# Patient Record
Sex: Female | Born: 1960 | Race: Black or African American | Hispanic: No | Marital: Single | Smoking: Former smoker
Health system: Southern US, Community
[De-identification: ages and names within clinical notes are randomized; demographics above are authoritative.]

## PROBLEM LIST (undated history)

## (undated) DIAGNOSIS — I639 Cerebral infarction, unspecified: Secondary | ICD-10-CM

## (undated) DIAGNOSIS — H409 Unspecified glaucoma: Secondary | ICD-10-CM

## (undated) DIAGNOSIS — I10 Essential (primary) hypertension: Secondary | ICD-10-CM

## (undated) DIAGNOSIS — M1990 Unspecified osteoarthritis, unspecified site: Secondary | ICD-10-CM

## (undated) HISTORY — PX: EYE SURGERY: SHX253

---

## 2017-12-29 DIAGNOSIS — F1120 Opioid dependence, uncomplicated: Secondary | ICD-10-CM | POA: Diagnosis not present

## 2018-01-05 DIAGNOSIS — F1120 Opioid dependence, uncomplicated: Secondary | ICD-10-CM | POA: Diagnosis not present

## 2018-01-09 DIAGNOSIS — Z79891 Long term (current) use of opiate analgesic: Secondary | ICD-10-CM | POA: Diagnosis not present

## 2018-01-12 DIAGNOSIS — F1120 Opioid dependence, uncomplicated: Secondary | ICD-10-CM | POA: Diagnosis not present

## 2018-01-19 DIAGNOSIS — F1120 Opioid dependence, uncomplicated: Secondary | ICD-10-CM | POA: Diagnosis not present

## 2018-01-26 DIAGNOSIS — F1120 Opioid dependence, uncomplicated: Secondary | ICD-10-CM | POA: Diagnosis not present

## 2018-01-27 DIAGNOSIS — R6889 Other general symptoms and signs: Secondary | ICD-10-CM | POA: Diagnosis not present

## 2018-02-02 DIAGNOSIS — F1120 Opioid dependence, uncomplicated: Secondary | ICD-10-CM | POA: Diagnosis not present

## 2018-02-06 DIAGNOSIS — Z79891 Long term (current) use of opiate analgesic: Secondary | ICD-10-CM | POA: Diagnosis not present

## 2018-02-09 DIAGNOSIS — F1120 Opioid dependence, uncomplicated: Secondary | ICD-10-CM | POA: Diagnosis not present

## 2018-02-16 DIAGNOSIS — F1120 Opioid dependence, uncomplicated: Secondary | ICD-10-CM | POA: Diagnosis not present

## 2018-02-23 DIAGNOSIS — F1120 Opioid dependence, uncomplicated: Secondary | ICD-10-CM | POA: Diagnosis not present

## 2018-03-02 DIAGNOSIS — F1120 Opioid dependence, uncomplicated: Secondary | ICD-10-CM | POA: Diagnosis not present

## 2018-03-09 DIAGNOSIS — R5383 Other fatigue: Secondary | ICD-10-CM | POA: Diagnosis not present

## 2018-03-09 DIAGNOSIS — E559 Vitamin D deficiency, unspecified: Secondary | ICD-10-CM | POA: Diagnosis not present

## 2018-03-09 DIAGNOSIS — I10 Essential (primary) hypertension: Secondary | ICD-10-CM | POA: Diagnosis not present

## 2018-03-09 DIAGNOSIS — F1120 Opioid dependence, uncomplicated: Secondary | ICD-10-CM | POA: Diagnosis not present

## 2018-03-14 DIAGNOSIS — F1120 Opioid dependence, uncomplicated: Secondary | ICD-10-CM | POA: Diagnosis not present

## 2018-03-16 DIAGNOSIS — F1120 Opioid dependence, uncomplicated: Secondary | ICD-10-CM | POA: Diagnosis not present

## 2018-03-30 DIAGNOSIS — F1120 Opioid dependence, uncomplicated: Secondary | ICD-10-CM | POA: Diagnosis not present

## 2018-04-06 DIAGNOSIS — F1120 Opioid dependence, uncomplicated: Secondary | ICD-10-CM | POA: Diagnosis not present

## 2018-04-10 DIAGNOSIS — F1120 Opioid dependence, uncomplicated: Secondary | ICD-10-CM | POA: Diagnosis not present

## 2018-04-25 DIAGNOSIS — F1120 Opioid dependence, uncomplicated: Secondary | ICD-10-CM | POA: Diagnosis not present

## 2018-04-27 DIAGNOSIS — F1120 Opioid dependence, uncomplicated: Secondary | ICD-10-CM | POA: Diagnosis not present

## 2018-04-30 DIAGNOSIS — F1120 Opioid dependence, uncomplicated: Secondary | ICD-10-CM | POA: Diagnosis not present

## 2018-05-04 DIAGNOSIS — F1120 Opioid dependence, uncomplicated: Secondary | ICD-10-CM | POA: Diagnosis not present

## 2018-05-07 DIAGNOSIS — F1120 Opioid dependence, uncomplicated: Secondary | ICD-10-CM | POA: Diagnosis not present

## 2018-05-14 DIAGNOSIS — F1120 Opioid dependence, uncomplicated: Secondary | ICD-10-CM | POA: Diagnosis not present

## 2018-05-21 DIAGNOSIS — F1120 Opioid dependence, uncomplicated: Secondary | ICD-10-CM | POA: Diagnosis not present

## 2018-05-30 DIAGNOSIS — F1120 Opioid dependence, uncomplicated: Secondary | ICD-10-CM | POA: Diagnosis not present

## 2018-06-06 DIAGNOSIS — F1120 Opioid dependence, uncomplicated: Secondary | ICD-10-CM | POA: Diagnosis not present

## 2018-06-07 DIAGNOSIS — F1120 Opioid dependence, uncomplicated: Secondary | ICD-10-CM | POA: Diagnosis not present

## 2018-06-13 DIAGNOSIS — F1120 Opioid dependence, uncomplicated: Secondary | ICD-10-CM | POA: Diagnosis not present

## 2018-06-20 DIAGNOSIS — F1120 Opioid dependence, uncomplicated: Secondary | ICD-10-CM | POA: Diagnosis not present

## 2018-06-26 DIAGNOSIS — I10 Essential (primary) hypertension: Secondary | ICD-10-CM | POA: Diagnosis not present

## 2018-06-26 DIAGNOSIS — E559 Vitamin D deficiency, unspecified: Secondary | ICD-10-CM | POA: Diagnosis not present

## 2018-06-26 DIAGNOSIS — R5383 Other fatigue: Secondary | ICD-10-CM | POA: Diagnosis not present

## 2018-06-26 DIAGNOSIS — Z0000 Encounter for general adult medical examination without abnormal findings: Secondary | ICD-10-CM | POA: Diagnosis not present

## 2018-06-26 DIAGNOSIS — Z1339 Encounter for screening examination for other mental health and behavioral disorders: Secondary | ICD-10-CM | POA: Diagnosis not present

## 2018-06-26 DIAGNOSIS — Z79891 Long term (current) use of opiate analgesic: Secondary | ICD-10-CM | POA: Diagnosis not present

## 2018-06-26 DIAGNOSIS — R0602 Shortness of breath: Secondary | ICD-10-CM | POA: Diagnosis not present

## 2018-06-26 DIAGNOSIS — Z131 Encounter for screening for diabetes mellitus: Secondary | ICD-10-CM | POA: Diagnosis not present

## 2018-06-27 DIAGNOSIS — F1120 Opioid dependence, uncomplicated: Secondary | ICD-10-CM | POA: Diagnosis not present

## 2018-06-29 DIAGNOSIS — F1120 Opioid dependence, uncomplicated: Secondary | ICD-10-CM | POA: Diagnosis not present

## 2018-07-04 DIAGNOSIS — F1120 Opioid dependence, uncomplicated: Secondary | ICD-10-CM | POA: Diagnosis not present

## 2018-07-06 DIAGNOSIS — F1120 Opioid dependence, uncomplicated: Secondary | ICD-10-CM | POA: Diagnosis not present

## 2018-07-13 DIAGNOSIS — F1120 Opioid dependence, uncomplicated: Secondary | ICD-10-CM | POA: Diagnosis not present

## 2018-07-20 DIAGNOSIS — F1120 Opioid dependence, uncomplicated: Secondary | ICD-10-CM | POA: Diagnosis not present

## 2018-07-27 DIAGNOSIS — F1120 Opioid dependence, uncomplicated: Secondary | ICD-10-CM | POA: Diagnosis not present

## 2018-08-03 DIAGNOSIS — F1120 Opioid dependence, uncomplicated: Secondary | ICD-10-CM | POA: Diagnosis not present

## 2018-08-08 DIAGNOSIS — F1120 Opioid dependence, uncomplicated: Secondary | ICD-10-CM | POA: Diagnosis not present

## 2018-08-10 DIAGNOSIS — F1120 Opioid dependence, uncomplicated: Secondary | ICD-10-CM | POA: Diagnosis not present

## 2018-08-17 DIAGNOSIS — F1120 Opioid dependence, uncomplicated: Secondary | ICD-10-CM | POA: Diagnosis not present

## 2018-08-24 DIAGNOSIS — F1120 Opioid dependence, uncomplicated: Secondary | ICD-10-CM | POA: Diagnosis not present

## 2018-08-25 DIAGNOSIS — F1120 Opioid dependence, uncomplicated: Secondary | ICD-10-CM | POA: Diagnosis not present

## 2018-08-31 DIAGNOSIS — F1120 Opioid dependence, uncomplicated: Secondary | ICD-10-CM | POA: Diagnosis not present

## 2018-09-01 DIAGNOSIS — F1120 Opioid dependence, uncomplicated: Secondary | ICD-10-CM | POA: Diagnosis not present

## 2018-09-07 DIAGNOSIS — F1120 Opioid dependence, uncomplicated: Secondary | ICD-10-CM | POA: Diagnosis not present

## 2018-09-14 DIAGNOSIS — F1120 Opioid dependence, uncomplicated: Secondary | ICD-10-CM | POA: Diagnosis not present

## 2018-09-15 DIAGNOSIS — F1120 Opioid dependence, uncomplicated: Secondary | ICD-10-CM | POA: Diagnosis not present

## 2019-08-16 ENCOUNTER — Other Ambulatory Visit: Payer: Self-pay | Admitting: Family Medicine

## 2019-08-16 DIAGNOSIS — Z1231 Encounter for screening mammogram for malignant neoplasm of breast: Secondary | ICD-10-CM

## 2019-11-12 ENCOUNTER — Ambulatory Visit: Payer: Self-pay

## 2019-12-25 HISTORY — PX: COLONOSCOPY: SHX174

## 2020-03-20 ENCOUNTER — Emergency Department (HOSPITAL_COMMUNITY)
Admission: EM | Admit: 2020-03-20 | Discharge: 2020-03-21 | Disposition: A | Discharge status: Home / Self Care | Payer: Medicare Other | Attending: Emergency Medicine | Admitting: Emergency Medicine

## 2020-03-20 DIAGNOSIS — T783XXA Angioneurotic edema, initial encounter: Secondary | ICD-10-CM

## 2020-03-20 DIAGNOSIS — R600 Localized edema: Secondary | ICD-10-CM | POA: Diagnosis not present

## 2020-03-20 DIAGNOSIS — I10 Essential (primary) hypertension: Secondary | ICD-10-CM | POA: Insufficient documentation

## 2020-03-20 DIAGNOSIS — T464X5A Adverse effect of angiotensin-converting-enzyme inhibitors, initial encounter: Secondary | ICD-10-CM | POA: Insufficient documentation

## 2020-03-20 DIAGNOSIS — Z79899 Other long term (current) drug therapy: Secondary | ICD-10-CM | POA: Insufficient documentation

## 2020-03-20 MED ORDER — DIPHENHYDRAMINE HCL 50 MG/ML IJ SOLN
25.0000 mg | Freq: Once | INTRAMUSCULAR | Status: AC
  Administered 2020-03-20: 25 mg via INTRAVENOUS
  Filled 2020-03-20: qty 1

## 2020-03-20 MED ORDER — TRANEXAMIC ACID-NACL 1000-0.7 MG/100ML-% IV SOLN
1000.0000 mg | Freq: Once | INTRAVENOUS | Status: AC
  Administered 2020-03-20: 1000 mg via INTRAVENOUS
  Filled 2020-03-20: qty 100

## 2020-03-20 MED ORDER — DEXAMETHASONE SODIUM PHOSPHATE 10 MG/ML IJ SOLN
10.0000 mg | Freq: Once | INTRAMUSCULAR | Status: AC
  Administered 2020-03-20: 10 mg via INTRAVENOUS
  Filled 2020-03-20: qty 1

## 2020-03-20 MED ORDER — FAMOTIDINE IN NACL 20-0.9 MG/50ML-% IV SOLN
20.0000 mg | INTRAVENOUS | Status: AC
  Administered 2020-03-20: 20 mg via INTRAVENOUS
  Filled 2020-03-20: qty 50

## 2020-03-20 NOTE — ED Triage Notes (Signed)
Pt to ED with upper and lower lip swelling.  No resp distress at this time.  Pt st's she takes Lisinopril

## 2020-03-20 NOTE — ED Provider Notes (Addendum)
[facility] EMERGENCY DEPARTMENT Provider Note   CSN: [id number] Arrival date & time: 03/20/20  2110     History Chief Complaint  Patient presents with  . Allergic Reaction    Diana Soto is a 59 y.o. female.  59 year old female with past medical history below including hypertension on lisinopril who presents with lip edema.  Patient ate food earlier today including a hummingbird cake that she has had before.  She laid down to sleep for a [name] while and when she got up, her daughter noticed that her upper lip appeared swollen.  Initially it was only on one side but now the entire lip is swollen.  Patient denies any swelling inside her mouth/on her tongue.  She denies any itching, hives, vomiting, breathing problems, vomiting, or other complaints.  This is never happened before.  She denies any new food exposures.  She has been on lisinopril since 2017.  No family history of angioedema.  Daughter gave her 1 Benadryl tablet earlier prior to arrival.  The history is provided by the patient and a relative.  Allergic Reaction      No past medical history on file.  There are no problems to display for this patient.    PMH: Blindness HTN   OB History   No obstetric history on file.     No family history on file.  Social History   Tobacco Use  . Smoking status: Not on file  Substance Use Topics  . Alcohol use: Not on file  . Drug use: Not on file    Home Medications Prior to Admission medications   Not on File    Allergies    Patient has no allergy information on record.  Review of Systems   Review of Systems  Physical Exam Updated Vital Signs BP 107/83   Pulse 71   Temp 98.6 F (37 C) (Oral)   Resp (!) 21   SpO2 93%   Physical Exam Vitals and nursing note reviewed.  Constitutional:      General: She is not in acute distress.    Appearance: She is well-developed.  HENT:     Head: Normocephalic and atraumatic.      Mouth/Throat:     Mouth: Mucous membranes are moist.     Comments: Edema of upper lip with no tongue involvement; poor dentition Eyes:     Comments: Blind b/l  Cardiovascular:     Rate and Rhythm: Normal rate and regular rhythm.     Heart sounds: Normal heart sounds. No murmur heard.   Pulmonary:     Effort: Pulmonary effort is normal.     Breath sounds: Normal breath sounds.  Abdominal:     General: Bowel sounds are normal. There is no distension.     Palpations: Abdomen is soft.     Tenderness: There is no abdominal tenderness.  Musculoskeletal:        General: No swelling.     Cervical back: Neck supple.  Skin:    General: Skin is warm and dry.     Findings: No rash.     Comments: No urticaria  Neurological:     Mental Status: She is alert and oriented to person, place, and time.     Comments: Fluent speech  Psychiatric:        Judgment: Judgment normal.     ED Results / Procedures / Treatments   Labs (all labs ordered are listed, but only abnormal results are displayed)  Labs Reviewed - No data to display  EKG None  Radiology No results found.  Procedures Procedures (including critical care time)  Medications Ordered in ED Medications  dexamethasone (DECADRON) injection 10 mg (10 mg Intravenous Given 03/20/20 2207)  diphenhydrAMINE (BENADRYL) injection 25 mg (25 mg Intravenous Given 03/20/20 2208)  famotidine (PEPCID) IVPB 20 mg premix (0 mg Intravenous Stopped 03/20/20 2244)  tranexamic acid (CYKLOKAPRON) IVPB 1,000 mg (0 mg Intravenous Stopped 03/20/20 2313)    ED Course  I have reviewed the triage vital signs and the nursing notes.      MDM Rules/Calculators/A&P                           Patient was comfortable on exam, reassuring vital signs, denying any breathing problems.  Edema limited to upper lip only.  No symptoms suggestive of anaphylaxis.  I strongly suspect ACE inhibitor angioedema as the patient has no family history of hereditary  angioedema and no new exposures that she can recall.  Gave the patient above medications and instructed to immediately discontinue ACE inhibitor and contact PCP for further instruction regarding BP management.  On reassessment, pt remains comfortable. No progression of swelling. Daughter feels comfortable watching her closely at home and will ensure lisinopril is removed from her medications. I have extensively reviewed return precautions and they voiced understanding.  Final Clinical Impression(s) / ED Diagnoses Final diagnoses:  ACE inhibitor-aggravated angioedema, initial encounter    Rx / DC Orders ED Discharge Orders    None       Zabrina Brotherton, Wenda Overland, MD 03/20/20 2346    Crispin Vogel, Wenda Overland, MD 03/20/20 2358

## 2020-07-29 ENCOUNTER — Other Ambulatory Visit: Payer: Self-pay | Admitting: Orthopedic Surgery

## 2020-07-29 DIAGNOSIS — Z01811 Encounter for preprocedural respiratory examination: Secondary | ICD-10-CM

## 2020-08-04 ENCOUNTER — Other Ambulatory Visit: Payer: Self-pay | Admitting: Orthopedic Surgery

## 2020-08-04 DIAGNOSIS — M19011 Primary osteoarthritis, right shoulder: Secondary | ICD-10-CM

## 2020-08-10 NOTE — Patient Instructions (Addendum)
DUE TO COVID-19 ONLY ONE VISITOR IS ALLOWED TO COME WITH YOU AND STAY IN THE WAITING ROOM ONLY DURING PRE OP AND PROCEDURE DAY OF SURGERY. THE 1 VISITOR  MAY VISIT WITH YOU AFTER SURGERY IN YOUR PRIVATE ROOM DURING VISITING HOURS ONLY!  YOU NEED TO HAVE A COVID 19 TEST ON: 08/11/20 @ 10:00 AM , THIS TEST MUST BE DONE BEFORE SURGERY,  COVID TESTING SITE [place] [address], IT IS ON THE RIGHT GOING OUT [address] APPROXIMATELY  2 MINUTES PAST [employer] ON THE RIGHT. ONCE YOUR COVID TEST IS COMPLETED,  PLEASE BEGIN THE QUARANTINE INSTRUCTIONS AS OUTLINED IN YOUR HANDOUT.                Marnee Spring    Your procedure is scheduled on: 08/13/20   Report to [facility] Main  Entrance   Report to admitting at : 9:20 AM     Call this number if you have problems the morning of surgery [phone]    Remember: NO SOLID FOOD AFTER MIDNIGHT THE NIGHT PRIOR TO SURGERY. NOTHING BY MOUTH EXCEPT CLEAR LIQUIDS UNTIL: 8:50 AM . PLEASE FINISH ENSURE DRINK PER SURGEON ORDER  WHICH NEEDS TO BE COMPLETED AT: 8:50 AM .  CLEAR LIQUID DIET  Foods Allowed                                                                     Foods Excluded  Coffee and tea, regular and decaf                             liquids that you cannot  Plain Jell-O any favor except red or purple                                           see through such as: Fruit ices (not with fruit pulp)                                     milk, soups, orange juice  Iced Popsicles                                    All solid food Carbonated beverages, regular and diet                                    Cranberry, grape and apple juices Sports drinks like Gatorade Lightly seasoned clear broth or consume(fat free) Sugar, honey syrup  Sample Menu Breakfast                                Lunch  Supper Cranberry juice                    Beef broth                             Chicken broth Jell-O                                     Grape juice                           Apple juice Coffee or tea                        Jell-O                                      Popsicle                                                Coffee or tea                        Coffee or tea  _____________________________________________________________________   BRUSH YOUR TEETH MORNING OF SURGERY AND RINSE YOUR MOUTH OUT, NO CHEWING GUM CANDY OR MINTS.    Take these medicines the morning of surgery with A SIP OF WATER: AMLODIPINE,CARVEDILOL,METHADONE.                               You may not have any metal on your body including hair pins and              piercings  Do not wear jewelry, make-up, lotions, powders or perfumes, deodorant             Do not wear nail polish on your fingernails.  Do not shave  48 hours prior to surgery.    Do not bring valuables to the hospital. [place].  Contacts, dentures or bridgework may not be worn into surgery.  Leave suitcase in the car. After surgery it may be brought to your room.     Patients discharged the day of surgery will not be allowed to drive home. IF YOU ARE HAVING SURGERY AND GOING HOME THE SAME DAY, YOU MUST HAVE AN ADULT TO DRIVE YOU HOME AND BE WITH YOU FOR 24 HOURS. YOU MAY GO HOME BY TAXI OR UBER OR ORTHERWISE, BUT AN ADULT MUST ACCOMPANY YOU HOME AND STAY WITH YOU FOR 24 HOURS.  Name and phone number of your driver:  Special Instructions: N/A              Please read over the following fact sheets you were given: _____________________________________________________________________  [facility]- Preparing for Total Shoulder Arthroplasty    Before surgery, you can play an important role. Because skin is not sterile, your skin needs to be as free of germs as possible. You can reduce the number of germs  on your skin by using the following products. . Benzoyl Peroxide  Gel o Reduces the number of germs present on the skin o Applied twice a day to shoulder area starting two days before surgery    ==================================================================  Please follow these instructions carefully:  BENZOYL PEROXIDE 5% GEL  Please do not use if you have an allergy to benzoyl peroxide.   If your skin becomes reddened/irritated stop using the benzoyl peroxide.  Starting two days before surgery, apply as follows: 1. Apply benzoyl peroxide in the morning and at night. Apply after taking a shower. If you are not taking a shower clean entire shoulder front, back, and side along with the armpit with a clean wet washcloth.  2. Place a quarter-sized dollop on your shoulder and rub in thoroughly, making sure to cover the front, back, and side of your shoulder, along with the armpit.   2 days before ____ AM   ____ PM              1 day before ____ AM   ____ PM                         3. Do this twice a day for two days.  (Last application is the night before surgery, AFTER using the CHG soap as described below).  4. Do NOT apply benzoyl peroxide gel on the day of surgery.          [facility] - Preparing for Surgery Before surgery, you can play an important role.  Because skin is not sterile, your skin needs to be as free of germs as possible.  You can reduce the number of germs on your skin by washing with CHG (chlorahexidine gluconate) soap before surgery.  CHG is an antiseptic cleaner which kills germs and bonds with the skin to continue killing germs even after washing. Please DO NOT use if you have an allergy to CHG or antibacterial soaps.  If your skin becomes reddened/irritated stop using the CHG and inform your nurse when you arrive at Short Stay. Do not shave (including legs and underarms) for at least 48 hours prior to the first CHG shower.  You may shave your face/neck. Please follow these instructions carefully:  1.  Shower with CHG Soap the  night before surgery and the  morning of Surgery.  2.  If you choose to wash your hair, wash your hair first as usual with your  normal  shampoo.  3.  After you shampoo, rinse your hair and body thoroughly to remove the  shampoo.                           4.  Use CHG as you would any other liquid soap.  You can apply chg directly  to the skin and wash                       Gently with a scrungie or clean washcloth.  5.  Apply the CHG Soap to your body ONLY FROM THE NECK DOWN.   Do not use on face/ open                           Wound or open sores. Avoid contact with eyes, ears mouth and genitals (private parts).  Wash face,  Genitals (private parts) with your normal soap.             6.  Wash thoroughly, paying special attention to the area where your surgery  will be performed.  7.  Thoroughly rinse your body with warm water from the neck down.  8.  DO NOT shower/wash with your normal soap after using and rinsing off  the CHG Soap.                9.  Pat yourself dry with a clean towel.            10.  Wear clean pajamas.            11.  Place clean sheets on your bed the night of your first shower and do not  sleep with pets. Day of Surgery : Do not apply any lotions/deodorants the morning of surgery.  Please wear clean clothes to the hospital/surgery center.  FAILURE TO FOLLOW THESE INSTRUCTIONS MAY RESULT IN THE CANCELLATION OF YOUR SURGERY PATIENT SIGNATURE_________________________________  NURSE SIGNATURE__________________________________  ________________________________________________________________________   Adam Phenix  An incentive spirometer is a tool that can help keep your lungs clear and active. This tool measures how well you are filling your lungs with each breath. Taking long deep breaths may help reverse or decrease the chance of developing breathing (pulmonary) problems (especially infection) following:  A long period of time when you  are unable to move or be active. BEFORE THE PROCEDURE   If the spirometer includes an indicator to show your best effort, your nurse or respiratory therapist will set it to a desired goal.  If possible, sit up straight or lean slightly forward. Try not to slouch.  Hold the incentive spirometer in an upright position. INSTRUCTIONS FOR USE  1. Sit on the edge of your bed if possible, or sit up as far as you can in bed or on a chair. 2. Hold the incentive spirometer in an upright position. 3. Breathe out normally. 4. Place the mouthpiece in your mouth and seal your lips tightly around it. 5. Breathe in slowly and as deeply as possible, raising the piston or the ball toward the top of the column. 6. Hold your breath for 3-5 seconds or for as long as possible. Allow the piston or ball to fall to the bottom of the column. 7. Remove the mouthpiece from your mouth and breathe out normally. 8. Rest for a few seconds and repeat Steps 1 through 7 at least 10 times every 1-2 hours when you are awake. Take your time and take a few normal breaths between deep breaths. 9. The spirometer may include an indicator to show your best effort. Use the indicator as a goal to work toward during each repetition. 10. After each set of 10 deep breaths, practice coughing to be sure your lungs are clear. If you have an incision (the cut made at the time of surgery), support your incision when coughing by placing a pillow or rolled up towels firmly against it. Once you are able to get out of bed, walk around indoors and cough well. You may stop using the incentive spirometer when instructed by your caregiver.  RISKS AND COMPLICATIONS  Take your time so you do not get dizzy or light-headed.  If you are in pain, you may need to take or ask for pain medication before doing incentive spirometry. It is harder to take a deep breath if you are having pain.  AFTER USE  Rest and breathe slowly and easily.  It can be helpful to  keep track of a log of your progress. Your caregiver can provide you with a simple table to help with this. If you are using the spirometer at home, follow these instructions: [place] IF:   You are having difficultly using the spirometer.  You have trouble using the spirometer as often as instructed.  Your pain medication is not giving enough relief while using the spirometer.  You develop fever of 100.5 F (38.1 C) or higher. SEEK IMMEDIATE MEDICAL CARE IF:   You cough up bloody sputum that had not been present before.  You develop fever of 102 F (38.9 C) or greater.  You develop worsening pain at or near the incision site. MAKE SURE YOU:   Understand these instructions.  Will watch your condition.  Will get help right away if you are not doing well or get worse. Document Released: 08/22/2006 Document Revised: 07/04/2011 Document Reviewed: 10/23/2006 [facility] Patient Information 2014 [place], [place].   ________________________________________________________________________

## 2020-08-11 ENCOUNTER — Other Ambulatory Visit: Payer: Self-pay

## 2020-08-11 ENCOUNTER — Ambulatory Visit
Admission: RE | Admit: 2020-08-11 | Discharge: 2020-08-11 | Disposition: A | Discharge status: Home / Self Care | Payer: Medicare Other | Source: Ambulatory Visit | Attending: Orthopedic Surgery | Admitting: Orthopedic Surgery

## 2020-08-11 ENCOUNTER — Encounter (HOSPITAL_COMMUNITY)
Admission: RE | Admit: 2020-08-11 | Discharge: 2020-08-11 | Disposition: A | Discharge status: Home / Self Care | Payer: Medicare Other | Source: Ambulatory Visit | Attending: Orthopedic Surgery | Admitting: Orthopedic Surgery

## 2020-08-11 ENCOUNTER — Encounter (HOSPITAL_COMMUNITY): Payer: Self-pay

## 2020-08-11 ENCOUNTER — Other Ambulatory Visit (HOSPITAL_COMMUNITY)
Admission: RE | Admit: 2020-08-11 | Discharge: 2020-08-11 | Disposition: A | Discharge status: Home / Self Care | Payer: Medicare Other | Source: Ambulatory Visit | Attending: Orthopedic Surgery | Admitting: Orthopedic Surgery

## 2020-08-11 ENCOUNTER — Ambulatory Visit (HOSPITAL_COMMUNITY)
Admission: RE | Admit: 2020-08-11 | Discharge: 2020-08-11 | Disposition: A | Discharge status: Home / Self Care | Payer: Medicare Other | Source: Ambulatory Visit | Attending: Orthopedic Surgery | Admitting: Orthopedic Surgery

## 2020-08-11 DIAGNOSIS — I517 Cardiomegaly: Secondary | ICD-10-CM | POA: Insufficient documentation

## 2020-08-11 DIAGNOSIS — Z20822 Contact with and (suspected) exposure to covid-19: Secondary | ICD-10-CM | POA: Insufficient documentation

## 2020-08-11 DIAGNOSIS — D480 Neoplasm of uncertain behavior of bone and articular cartilage: Secondary | ICD-10-CM | POA: Insufficient documentation

## 2020-08-11 DIAGNOSIS — Z01811 Encounter for preprocedural respiratory examination: Secondary | ICD-10-CM

## 2020-08-11 DIAGNOSIS — M19011 Primary osteoarthritis, right shoulder: Secondary | ICD-10-CM

## 2020-08-11 DIAGNOSIS — I77819 Aortic ectasia, unspecified site: Secondary | ICD-10-CM | POA: Insufficient documentation

## 2020-08-11 DIAGNOSIS — Z01818 Encounter for other preprocedural examination: Secondary | ICD-10-CM | POA: Diagnosis not present

## 2020-08-11 HISTORY — DX: Unspecified glaucoma: H40.9

## 2020-08-11 HISTORY — DX: Essential (primary) hypertension: I10

## 2020-08-11 HISTORY — DX: Unspecified osteoarthritis, unspecified site: M19.90

## 2020-08-11 HISTORY — DX: Cerebral infarction, unspecified: I63.9

## 2020-08-11 LAB — URINALYSIS, MICROSCOPIC (REFLEX)
Bacteria, UA: NONE SEEN
Squamous Epithelial / HPF: NONE SEEN (ref 0–5)
WBC, UA: NONE SEEN WBC/hpf (ref 0–5)

## 2020-08-11 LAB — URINALYSIS, ROUTINE W REFLEX MICROSCOPIC
Bilirubin Urine: NEGATIVE
Glucose, UA: NEGATIVE mg/dL
Ketones, ur: NEGATIVE mg/dL
Leukocytes,Ua: NEGATIVE
Nitrite: NEGATIVE
Protein, ur: 100 mg/dL — AB
Specific Gravity, Urine: 1.025 (ref 1.005–1.030)
pH: 6 (ref 5.0–8.0)

## 2020-08-11 LAB — SURGICAL PCR SCREEN
MRSA, PCR: NEGATIVE
Staphylococcus aureus: NEGATIVE

## 2020-08-11 LAB — CBC WITH DIFFERENTIAL/PLATELET
Abs Immature Granulocytes: 0.02 10*3/uL (ref 0.00–0.07)
Basophils Absolute: 0 10*3/uL (ref 0.0–0.1)
Basophils Relative: 0 %
Eosinophils Absolute: 0.7 10*3/uL — ABNORMAL HIGH (ref 0.0–0.5)
Eosinophils Relative: 7 %
HCT: 42.1 % (ref 36.0–46.0)
Hemoglobin: 13 g/dL (ref 12.0–15.0)
Immature Granulocytes: 0 %
Lymphocytes Relative: 26 %
Lymphs Abs: 2.6 10*3/uL (ref 0.7–4.0)
MCH: 24.1 pg — ABNORMAL LOW (ref 26.0–34.0)
MCHC: 30.9 g/dL (ref 30.0–36.0)
MCV: 78 fL — ABNORMAL LOW (ref 80.0–100.0)
Monocytes Absolute: 1 10*3/uL (ref 0.1–1.0)
Monocytes Relative: 10 %
Neutro Abs: 5.4 10*3/uL (ref 1.7–7.7)
Neutrophils Relative %: 57 %
Platelets: 174 10*3/uL (ref 150–400)
RBC: 5.4 MIL/uL — ABNORMAL HIGH (ref 3.87–5.11)
RDW: 14 % (ref 11.5–15.5)
WBC: 9.7 10*3/uL (ref 4.0–10.5)
nRBC: 0 % (ref 0.0–0.2)

## 2020-08-11 LAB — COMPREHENSIVE METABOLIC PANEL
ALT: 14 U/L (ref 0–44)
AST: 27 U/L (ref 15–41)
Albumin: 3.6 g/dL (ref 3.5–5.0)
Alkaline Phosphatase: 95 U/L (ref 38–126)
Anion gap: 11 (ref 5–15)
BUN: 17 mg/dL (ref 6–20)
CO2: 24 mmol/L (ref 22–32)
Calcium: 9.6 mg/dL (ref 8.9–10.3)
Chloride: 103 mmol/L (ref 98–111)
Creatinine, Ser: 0.9 mg/dL (ref 0.44–1.00)
GFR, Estimated: >60 mL/min (ref >60)
Glucose, Bld: 81 mg/dL (ref 70–99)
Potassium: 4.4 mmol/L (ref 3.5–5.1)
Sodium: 138 mmol/L (ref 135–145)
Total Bilirubin: 0.6 mg/dL (ref 0.3–1.2)
Total Protein: 7.4 g/dL (ref 6.5–8.1)

## 2020-08-11 LAB — SARS CORONAVIRUS 2 (TAT 6-24 HRS): SARS Coronavirus 2: NEGATIVE

## 2020-08-11 LAB — APTT: aPTT: 22 seconds — ABNORMAL LOW (ref 24–36)

## 2020-08-11 NOTE — Progress Notes (Addendum)
COVID Vaccine Completed: Date COVID Vaccine completed: COVID vaccine manufacturer: [employer]   PCP - Dr. Kristie Cowman Cardiologist -   Chest x-ray -  EKG -  Stress Test -  ECHO -  Cardiac Cath -  Pacemaker/ICD device last checked:  Sleep Study -  CPAP -   Fasting Blood Sugar -  Checks Blood Sugar _____ times a day  Blood Thinner Instructions: Aspirin Instructions: Last Dose:  Anesthesia review: hX: HTN,stroke,glaucoma(legally blind)  Patient denies shortness of breath, fever, cough and chest pain at PAT appointment   Patient verbalized understanding of instructions that were given to them at the PAT appointment. Patient was also instructed that they will need to review over the PAT instructions again at home before surgery.

## 2020-08-12 LAB — TYPE AND SCREEN
ABO/RH(D): O POS
Antibody Screen: NEGATIVE

## 2020-08-13 ENCOUNTER — Ambulatory Visit (HOSPITAL_COMMUNITY): Payer: Medicare (Managed Care) | Admitting: Anesthesiology

## 2020-08-13 ENCOUNTER — Observation Stay (HOSPITAL_COMMUNITY): Payer: Medicare (Managed Care)

## 2020-08-13 ENCOUNTER — Encounter (HOSPITAL_COMMUNITY): Payer: Self-pay | Admitting: Orthopedic Surgery

## 2020-08-13 ENCOUNTER — Observation Stay (HOSPITAL_COMMUNITY)
Admission: RE | Admit: 2020-08-13 | Discharge: 2020-08-14 | Disposition: A | Discharge status: Home / Self Care | Payer: Medicare (Managed Care) | Attending: Orthopedic Surgery | Admitting: Orthopedic Surgery

## 2020-08-13 ENCOUNTER — Other Ambulatory Visit: Payer: Self-pay

## 2020-08-13 ENCOUNTER — Ambulatory Visit (HOSPITAL_COMMUNITY): Payer: Medicare (Managed Care) | Admitting: Emergency Medicine

## 2020-08-13 ENCOUNTER — Encounter (HOSPITAL_COMMUNITY)
Admission: RE | Disposition: A | Discharge status: Home / Self Care | Payer: Self-pay | Source: Home / Self Care | Attending: Orthopedic Surgery

## 2020-08-13 DIAGNOSIS — Z87891 Personal history of nicotine dependence: Secondary | ICD-10-CM | POA: Diagnosis not present

## 2020-08-13 DIAGNOSIS — M19011 Primary osteoarthritis, right shoulder: Principal | ICD-10-CM | POA: Insufficient documentation

## 2020-08-13 DIAGNOSIS — Z8673 Personal history of transient ischemic attack (TIA), and cerebral infarction without residual deficits: Secondary | ICD-10-CM | POA: Diagnosis not present

## 2020-08-13 DIAGNOSIS — Z96611 Presence of right artificial shoulder joint: Secondary | ICD-10-CM

## 2020-08-13 DIAGNOSIS — Z79899 Other long term (current) drug therapy: Secondary | ICD-10-CM | POA: Diagnosis not present

## 2020-08-13 DIAGNOSIS — I10 Essential (primary) hypertension: Secondary | ICD-10-CM | POA: Insufficient documentation

## 2020-08-13 DIAGNOSIS — Z9889 Other specified postprocedural states: Secondary | ICD-10-CM | POA: Diagnosis present

## 2020-08-13 HISTORY — PX: TOTAL SHOULDER ARTHROPLASTY: SHX126

## 2020-08-13 LAB — ABO/RH: ABO/RH(D): O POS

## 2020-08-13 SURGERY — ARTHROPLASTY, SHOULDER, TOTAL
Anesthesia: Regional | Site: Shoulder | Laterality: Right

## 2020-08-13 MED ORDER — FERROUS GLUCONATE 324 (38 FE) MG PO TABS
648.0000 mg | ORAL_TABLET | Freq: Every day | ORAL | Status: DC
  Administered 2020-08-14: 648 mg via ORAL
  Filled 2020-08-13 (×3): qty 2

## 2020-08-13 MED ORDER — FENTANYL CITRATE (PF) 100 MCG/2ML IJ SOLN: 25.0000 ug | INTRAMUSCULAR | Status: DC | PRN

## 2020-08-13 MED ORDER — CEFAZOLIN SODIUM-DEXTROSE 2-4 GM/100ML-% IV SOLN
2.0000 g | INTRAVENOUS | Status: AC
  Administered 2020-08-13: 2 g via INTRAVENOUS
  Filled 2020-08-13: qty 100

## 2020-08-13 MED ORDER — METHOCARBAMOL 500 MG PO TABS
500.0000 mg | ORAL_TABLET | Freq: Four times a day (QID) | ORAL | Status: DC | PRN
  Administered 2020-08-13 – 2020-08-14 (×3): 500 mg via ORAL
  Filled 2020-08-13 (×3): qty 1

## 2020-08-13 MED ORDER — ZOLPIDEM TARTRATE 5 MG PO TABS: 5.0000 mg | ORAL_TABLET | Freq: Every evening | ORAL | Status: DC | PRN

## 2020-08-13 MED ORDER — MENTHOL 3 MG MT LOZG: 1.0000 | LOZENGE | OROMUCOSAL | Status: DC | PRN

## 2020-08-13 MED ORDER — FENTANYL CITRATE (PF) 100 MCG/2ML IJ SOLN
INTRAMUSCULAR | Status: AC
  Filled 2020-08-13: qty 2

## 2020-08-13 MED ORDER — CARVEDILOL 12.5 MG PO TABS
12.5000 mg | ORAL_TABLET | Freq: Two times a day (BID) | ORAL | Status: DC
  Administered 2020-08-13 – 2020-08-14 (×2): 12.5 mg via ORAL
  Filled 2020-08-13 (×2): qty 1

## 2020-08-13 MED ORDER — METOCLOPRAMIDE HCL 5 MG/ML IJ SOLN: 5.0000 mg | Freq: Three times a day (TID) | INTRAMUSCULAR | Status: DC | PRN

## 2020-08-13 MED ORDER — DOCUSATE SODIUM 100 MG PO CAPS
100.0000 mg | ORAL_CAPSULE | Freq: Two times a day (BID) | ORAL | Status: DC
  Administered 2020-08-13 – 2020-08-14 (×2): 100 mg via ORAL
  Filled 2020-08-13 (×2): qty 1

## 2020-08-13 MED ORDER — OXYCODONE HCL 5 MG PO TABS
5.0000 mg | ORAL_TABLET | ORAL | Status: DC | PRN
  Filled 2020-08-13: qty 2

## 2020-08-13 MED ORDER — ASPIRIN EC 325 MG PO TBEC
325.0000 mg | DELAYED_RELEASE_TABLET | Freq: Every day | ORAL | Status: DC
  Administered 2020-08-14: 325 mg via ORAL
  Filled 2020-08-13: qty 1

## 2020-08-13 MED ORDER — LIDOCAINE 2% (20 MG/ML) 5 ML SYRINGE
INTRAMUSCULAR | Status: AC
  Filled 2020-08-13: qty 5

## 2020-08-13 MED ORDER — TRANEXAMIC ACID-NACL 1000-0.7 MG/100ML-% IV SOLN
1000.0000 mg | INTRAVENOUS | Status: AC
  Administered 2020-08-13: 1000 mg via INTRAVENOUS
  Filled 2020-08-13: qty 100

## 2020-08-13 MED ORDER — METOCLOPRAMIDE HCL 5 MG PO TABS: 5.0000 mg | ORAL_TABLET | Freq: Three times a day (TID) | ORAL | Status: DC | PRN

## 2020-08-13 MED ORDER — SPIRONOLACTONE 25 MG PO TABS
25.0000 mg | ORAL_TABLET | Freq: Every day | ORAL | Status: DC
  Administered 2020-08-14: 25 mg via ORAL
  Filled 2020-08-13 (×2): qty 1

## 2020-08-13 MED ORDER — PHENOL 1.4 % MT LIQD: 1.0000 | OROMUCOSAL | Status: DC | PRN

## 2020-08-13 MED ORDER — LACTATED RINGERS IV SOLN: INTRAVENOUS | Status: DC

## 2020-08-13 MED ORDER — DEXAMETHASONE SODIUM PHOSPHATE 4 MG/ML IJ SOLN
INTRAMUSCULAR | Status: DC | PRN
  Administered 2020-08-13: 10 mg via INTRAVENOUS

## 2020-08-13 MED ORDER — PROPOFOL 10 MG/ML IV BOLUS
INTRAVENOUS | Status: AC
  Filled 2020-08-13: qty 20

## 2020-08-13 MED ORDER — ACETAMINOPHEN 10 MG/ML IV SOLN: 1000.0000 mg | Freq: Once | INTRAVENOUS | Status: DC | PRN

## 2020-08-13 MED ORDER — FENTANYL CITRATE (PF) 100 MCG/2ML IJ SOLN
50.0000 ug | Freq: Once | INTRAMUSCULAR | Status: AC
  Administered 2020-08-13: 50 ug via INTRAVENOUS
  Filled 2020-08-13: qty 2

## 2020-08-13 MED ORDER — ONDANSETRON HCL 4 MG PO TABS: 4.0000 mg | ORAL_TABLET | Freq: Four times a day (QID) | ORAL | Status: DC | PRN

## 2020-08-13 MED ORDER — ROCURONIUM BROMIDE 10 MG/ML (PF) SYRINGE
PREFILLED_SYRINGE | INTRAVENOUS | Status: AC
  Filled 2020-08-13: qty 10

## 2020-08-13 MED ORDER — METHOCARBAMOL 1000 MG/10ML IJ SOLN
500.0000 mg | Freq: Four times a day (QID) | INTRAVENOUS | Status: DC | PRN
  Filled 2020-08-13: qty 5

## 2020-08-13 MED ORDER — SODIUM CHLORIDE 0.9 % IR SOLN
Status: DC | PRN
  Administered 2020-08-13 (×2): 1000 mL

## 2020-08-13 MED ORDER — OXYCODONE HCL 5 MG PO TABS
10.0000 mg | ORAL_TABLET | ORAL | Status: DC | PRN
  Administered 2020-08-13: 15 mg via ORAL
  Administered 2020-08-13 – 2020-08-14 (×2): 10 mg via ORAL
  Filled 2020-08-13: qty 2
  Filled 2020-08-13: qty 3

## 2020-08-13 MED ORDER — WATER FOR IRRIGATION, STERILE IR SOLN
Status: DC | PRN
  Administered 2020-08-13: 2000 mL

## 2020-08-13 MED ORDER — ONDANSETRON HCL 4 MG/2ML IJ SOLN: 4.0000 mg | Freq: Four times a day (QID) | INTRAMUSCULAR | Status: DC | PRN

## 2020-08-13 MED ORDER — HYDROMORPHONE HCL 1 MG/ML IJ SOLN
0.5000 mg | INTRAMUSCULAR | Status: DC | PRN
  Administered 2020-08-13 – 2020-08-14 (×3): 1 mg via INTRAVENOUS
  Filled 2020-08-13 (×3): qty 1

## 2020-08-13 MED ORDER — ORAL CARE MOUTH RINSE: 15.0000 mL | Freq: Once | OROMUCOSAL | Status: AC

## 2020-08-13 MED ORDER — ONDANSETRON HCL 4 MG/2ML IJ SOLN
INTRAMUSCULAR | Status: DC | PRN
  Administered 2020-08-13: 4 mg via INTRAVENOUS

## 2020-08-13 MED ORDER — PROPOFOL 10 MG/ML IV BOLUS
INTRAVENOUS | Status: DC | PRN
  Administered 2020-08-13: 160 mg via INTRAVENOUS

## 2020-08-13 MED ORDER — SENNOSIDES-DOCUSATE SODIUM 8.6-50 MG PO TABS: 1.0000 | ORAL_TABLET | Freq: Every evening | ORAL | Status: DC | PRN

## 2020-08-13 MED ORDER — BUPIVACAINE LIPOSOME 1.3 % IJ SUSP
INTRAMUSCULAR | Status: DC | PRN
  Administered 2020-08-13: 10 mL via PERINEURAL

## 2020-08-13 MED ORDER — METHADONE HCL 10 MG PO TABS
40.0000 mg | ORAL_TABLET | Freq: Every day | ORAL | Status: DC
  Administered 2020-08-14: 40 mg via ORAL
  Filled 2020-08-13: qty 4

## 2020-08-13 MED ORDER — SUGAMMADEX SODIUM 200 MG/2ML IV SOLN
INTRAVENOUS | Status: DC | PRN
  Administered 2020-08-13: 200 mg via INTRAVENOUS

## 2020-08-13 MED ORDER — SODIUM CHLORIDE 0.9 % IV SOLN: INTRAVENOUS | Status: DC

## 2020-08-13 MED ORDER — PHENYLEPHRINE 40 MCG/ML (10ML) SYRINGE FOR IV PUSH (FOR BLOOD PRESSURE SUPPORT)
PREFILLED_SYRINGE | INTRAVENOUS | Status: DC | PRN
  Administered 2020-08-13: 40 ug via INTRAVENOUS

## 2020-08-13 MED ORDER — LIDOCAINE 2% (20 MG/ML) 5 ML SYRINGE
INTRAMUSCULAR | Status: DC | PRN
  Administered 2020-08-13: 60 mg via INTRAVENOUS

## 2020-08-13 MED ORDER — MIDAZOLAM HCL 2 MG/2ML IJ SOLN
1.0000 mg | Freq: Once | INTRAMUSCULAR | Status: AC
Start: 2020-08-13 — End: 2020-08-13
  Administered 2020-08-13: 2 mg via INTRAVENOUS
  Filled 2020-08-13: qty 2

## 2020-08-13 MED ORDER — ROCURONIUM BROMIDE 10 MG/ML (PF) SYRINGE
PREFILLED_SYRINGE | INTRAVENOUS | Status: DC | PRN
  Administered 2020-08-13: 60 mg via INTRAVENOUS

## 2020-08-13 MED ORDER — CHLORHEXIDINE GLUCONATE 0.12 % MT SOLN
15.0000 mL | Freq: Once | OROMUCOSAL | Status: AC
  Administered 2020-08-13: 15 mL via OROMUCOSAL

## 2020-08-13 MED ORDER — HYDROCHLOROTHIAZIDE 12.5 MG PO CAPS
12.5000 mg | ORAL_CAPSULE | Freq: Every day | ORAL | Status: DC
  Administered 2020-08-14: 12.5 mg via ORAL
  Filled 2020-08-13: qty 1

## 2020-08-13 MED ORDER — BUPIVACAINE HCL (PF) 0.5 % IJ SOLN
INTRAMUSCULAR | Status: DC | PRN
  Administered 2020-08-13: 15 mL via PERINEURAL

## 2020-08-13 MED ORDER — OXYCODONE HCL 5 MG PO TABS
5.0000 mg | ORAL_TABLET | Freq: Once | ORAL | Status: DC | PRN
Start: 2020-08-13 — End: 2020-08-13

## 2020-08-13 MED ORDER — OXYCODONE HCL 5 MG/5ML PO SOLN: 5.0000 mg | Freq: Once | ORAL | Status: DC | PRN

## 2020-08-13 MED ORDER — FENTANYL CITRATE (PF) 100 MCG/2ML IJ SOLN
INTRAMUSCULAR | Status: DC | PRN
  Administered 2020-08-13: 100 ug via INTRAVENOUS
  Administered 2020-08-13 (×2): 50 ug via INTRAVENOUS

## 2020-08-13 MED ORDER — PROMETHAZINE HCL 25 MG/ML IJ SOLN: 6.2500 mg | INTRAMUSCULAR | Status: DC | PRN

## 2020-08-13 MED ORDER — PHENYLEPHRINE 40 MCG/ML (10ML) SYRINGE FOR IV PUSH (FOR BLOOD PRESSURE SUPPORT)
PREFILLED_SYRINGE | INTRAVENOUS | Status: AC
  Filled 2020-08-13: qty 10

## 2020-08-13 MED ORDER — ACETAMINOPHEN 325 MG PO TABS: 325.0000 mg | ORAL_TABLET | Freq: Four times a day (QID) | ORAL | Status: DC | PRN

## 2020-08-13 MED ORDER — AMLODIPINE BESYLATE 10 MG PO TABS
10.0000 mg | ORAL_TABLET | Freq: Every day | ORAL | Status: DC
  Administered 2020-08-14: 10 mg via ORAL
  Filled 2020-08-13: qty 1

## 2020-08-13 MED ORDER — DIPHENHYDRAMINE HCL 12.5 MG/5ML PO ELIX: 12.5000 mg | ORAL_SOLUTION | ORAL | Status: DC | PRN

## 2020-08-13 SURGICAL SUPPLY — 62 items
BAG ZIPLOCK 12X15 (MISCELLANEOUS) ×2 IMPLANT
BIT DRILL 1.6MX128 (BIT) ×2 IMPLANT
BLADE SAW SAG 73X25 THK (BLADE) ×1
BLADE SAW SGTL 73X25 THK (BLADE) ×1 IMPLANT
CEMENT BONE DEPUY (Cement) ×2 IMPLANT
COOLER ICEMAN CLASSIC (MISCELLANEOUS) ×2 IMPLANT
COVER BACK TABLE 60X90IN (DRAPES) ×2 IMPLANT
COVER SURGICAL LIGHT HANDLE (MISCELLANEOUS) ×2 IMPLANT
COVER WAND RF STERILE (DRAPES) IMPLANT
DRAPE INCISE IOBAN 66X45 STRL (DRAPES) ×2 IMPLANT
DRAPE ORTHO SPLIT 77X108 STRL (DRAPES) ×2
DRAPE POUCH INSTRU U-SHP 10X18 (DRAPES) ×2 IMPLANT
DRAPE SURG 17X11 SM STRL (DRAPES) ×2 IMPLANT
DRAPE SURG ORHT 6 SPLT 77X108 (DRAPES) ×2 IMPLANT
DRAPE TOP 10253 STERILE (DRAPES) ×2 IMPLANT
DRAPE U-SHAPE 47X51 STRL (DRAPES) ×2 IMPLANT
DRSG AQUACEL AG ADV 3.5X 6 (GAUZE/BANDAGES/DRESSINGS) ×2 IMPLANT
DURAPREP 26ML APPLICATOR (WOUND CARE) ×4 IMPLANT
ELECT BLADE TIP CTD 4 INCH (ELECTRODE) ×2 IMPLANT
ELECT REM PT RETURN 15FT ADLT (MISCELLANEOUS) ×2 IMPLANT
GLENOID PEG SHOULDER 40MM SML (Shoulder) ×1 IMPLANT
GLOVE SRG 8 PF TXTR STRL LF DI (GLOVE) ×1 IMPLANT
GLOVE SURG ENC MOIS LTX SZ7 (GLOVE) ×2 IMPLANT
GLOVE SURG ENC MOIS LTX SZ7.5 (GLOVE) ×2 IMPLANT
GLOVE SURG UNDER POLY LF SZ7 (GLOVE) ×2 IMPLANT
GLOVE SURG UNDER POLY LF SZ8 (GLOVE) ×1
GOWN STRL REUS W/TWL LRG LVL3 (GOWN DISPOSABLE) ×2 IMPLANT
GOWN STRL REUS W/TWL XL LVL3 (GOWN DISPOSABLE) ×2 IMPLANT
GUIDEWIRE GLENOID 2.5X220 (WIRE) ×2 IMPLANT
HANDPIECE INTERPULSE COAX TIP (DISPOSABLE) ×1
HEAD HUM AEQUALIS 43X16 (Head) ×2 IMPLANT
HEMOSTAT SURGICEL 2X14 (HEMOSTASIS) ×2 IMPLANT
HOOD PEEL AWAY FLYTE STAYCOOL (MISCELLANEOUS) ×6 IMPLANT
KIT BASIN OR (CUSTOM PROCEDURE TRAY) ×2 IMPLANT
KIT TURNOVER KIT A (KITS) ×2 IMPLANT
MANIFOLD NEPTUNE II (INSTRUMENTS) ×2 IMPLANT
NEEDLE TROCAR POINT SZ 2 1/2 (NEEDLE) ×2 IMPLANT
NS IRRIG 1000ML POUR BTL (IV SOLUTION) ×2 IMPLANT
PACK SHOULDER (CUSTOM PROCEDURE TRAY) ×2 IMPLANT
PAD COLD SHLDR WRAP-ON (PAD) ×2 IMPLANT
PROTECTOR NERVE ULNAR (MISCELLANEOUS) IMPLANT
RESTRAINT HEAD UNIVERSAL NS (MISCELLANEOUS) ×2 IMPLANT
RETRIEVER SUT HEWSON (MISCELLANEOUS) ×2 IMPLANT
SET HNDPC FAN SPRY TIP SCT (DISPOSABLE) ×1 IMPLANT
SHOULDER GLENOID PEG 40MM SML (Shoulder) ×2 IMPLANT
SLING ARM IMMOBILIZER LRG (SOFTGOODS) ×2 IMPLANT
SMARTMIX MINI TOWER (MISCELLANEOUS) ×2
SPONGE LAP 18X18 RF (DISPOSABLE) ×2 IMPLANT
STEM HUM AEQUALIS PF SZ1A 66 (Stem) ×2 IMPLANT
STRIP CLOSURE SKIN 1/2X4 (GAUZE/BANDAGES/DRESSINGS) ×2 IMPLANT
SUCTION FRAZIER HANDLE 12FR (TUBING) ×1
SUCTION TUBE FRAZIER 12FR DISP (TUBING) ×1 IMPLANT
SUPPORT WRAP ARM LG (MISCELLANEOUS) ×2 IMPLANT
SUT ETHIBOND 2 V 37 (SUTURE) ×4 IMPLANT
SUT MNCRL AB 4-0 PS2 18 (SUTURE) ×2 IMPLANT
SUT VIC AB 2-0 CT1 27 (SUTURE) ×2
SUT VIC AB 2-0 CT1 TAPERPNT 27 (SUTURE) ×2 IMPLANT
TAPE LABRALWHITE 1.5X36 (TAPE) ×4 IMPLANT
TAPE SUT LABRALTAP WHT/BLK (SUTURE) ×2 IMPLANT
TOWEL OR 17X26 10 PK STRL BLUE (TOWEL DISPOSABLE) ×2 IMPLANT
TOWER SMARTMIX MINI (MISCELLANEOUS) ×1 IMPLANT
WATER STERILE IRR 1000ML POUR (IV SOLUTION) ×4 IMPLANT

## 2020-08-13 NOTE — Anesthesia Preprocedure Evaluation (Addendum)
Anesthesia Evaluation  Patient identified by MRN, date of birth, ID band Patient awake    Reviewed: Allergy & Precautions, NPO status , Patient's Chart, lab work & pertinent test results  Airway Mallampati: II       Dental  (+) Missing, Poor Dentition   Pulmonary neg pulmonary ROS, former smoker,    Pulmonary exam normal        Cardiovascular hypertension, Pt. on medications and Pt. on home beta blockers  Rhythm:Regular     Neuro/Psych CVA negative psych ROS   GI/Hepatic negative GI ROS, Neg liver ROS,   Endo/Other  negative endocrine ROS  Renal/GU negative Renal ROS  negative genitourinary   Musculoskeletal  (+) Arthritis , Osteoarthritis,    Abdominal   Peds  Hematology negative hematology ROS (+)   Anesthesia Other Findings Bilateral blindness  Reproductive/Obstetrics                            Anesthesia Physical Anesthesia Plan  ASA: III  Anesthesia Plan: General and Regional   Post-op Pain Management:  Regional for Post-op pain   Induction: Intravenous  PONV Risk Score and Plan: 3 and Ondansetron, Dexamethasone, Midazolam and Treatment may vary due to age or medical condition  Airway Management Planned: Mask and Oral ETT  Additional Equipment: None  Intra-op Plan:   Post-operative Plan: Extubation in OR  Informed Consent: I have reviewed the patients History and Physical, chart, labs and discussed the procedure including the risks, benefits and alternatives for the proposed anesthesia with the patient or authorized representative who has indicated his/her understanding and acceptance.     Dental advisory given  Plan Discussed with: CRNA  Anesthesia Plan Comments: (Lab Results      Component                Value               Date                      WBC                      9.7                 08/11/2020                HGB                      13.0                 08/11/2020                HCT                      42.1                08/11/2020                MCV                      78.0 (L)            08/11/2020                PLT                      174  08/11/2020           Lab Results      Component                Value               Date                      NA                       138                 08/11/2020                K                        4.4                 08/11/2020                CO2                      24                  08/11/2020                GLUCOSE                  81                  08/11/2020                BUN                      17                  08/11/2020                CREATININE               0.90                08/11/2020                CALCIUM                  9.6                 08/11/2020                GFRNONAA                 >60                 08/11/2020          )       Anesthesia Quick Evaluation

## 2020-08-13 NOTE — Op Note (Signed)
Procedure(s): TOTAL SHOULDER ARTHROPLASTY Procedure Note  Diana Soto female 60 y.o. 08/13/2020  Preoperative diagnosis: Right shoulder end-stage osteoarthritis  Postoperative diagnosis: Same  Procedure(s) and Anesthesia Type:   RIGHT TOTAL SHOULDER ARTHROPLASTY - Choice  Surgeon(s) and Role:    Tania Ade, MD - Primary   Indications:  60 y.o. female  With endstage right shoulder arthritis. Pain and dysfunction interfered with quality of life and nonoperative treatment with activity modification, NSAIDS and injections failed.     Surgeon: Isabella Stalling   Assistants: Jeanmarie Hubert PA-C [facility] was present and scrubbed throughout the procedure and was essential in positioning, retraction, exposure, and closure)  Anesthesia: General endotracheal anesthesia with preoperative interscalene block given by the attending anesthesiologist   Procedure Detail  TOTAL SHOULDER ARTHROPLASTY  Findings: Tornier flex anatomic press-fit size 1 stem with a 43 head, cemented size small 40 Cortiloc glenoid.   A lesser tuberosity osteotomy was performed and repaired at the conclusion of the procedure.  Estimated Blood Loss:  200 mL         Drains: None   Blood Given: none          Specimens: none        Complications:  * No complications entered in OR log *         Disposition: PACU - hemodynamically stable.         Condition: stable    Procedure:   The patient was identified in the preoperative holding area where I personally marked the operative extremity after verifying with the patient and consent. She  was taken to the operating room where She was transferred to the   operative table.  The patient received an interscalene block in   the holding area by the attending anesthesiologist.  General anesthesia was induced   in the operating room without complication.  The patient did receive IV  Ancef prior to the commencement of the procedure.  The patient was    placed in the beach-chair position with the back raised about 30   degrees.  The nonoperative extremity and head and neck were carefully   positioned and padded protecting against neurovascular compromise.  The   left upper extremity was then prepped and draped in the standard sterile   fashion.    The appropriate operative time-out was performed with   Anesthesia, the perioperative staff, as well as myself and we all agreed   that the right side was the correct operative site.  An approximately   10 cm incision was made from the tip of the coracoid to the center point of the   humerus at the level of the axilla.  Dissection was carried down sharply   through subcutaneous tissues and cephalic vein was identified and taken   laterally with the deltoid.  The pectoralis major was taken medially.  The   upper 1 cm of the pectoralis major was released from its attachment on   the humerus.  The clavipectoral fascia was incised just lateral to the   conjoined tendon.  This incision was carried up to but not into the   coracoacromial ligament.  Digital palpation was used to prove   integrity of the axillary nerve which was protected throughout the   procedure.  Musculocutaneous nerve was not palpated in the operative   field.  Conjoined tendon was then retracted gently medially and the   deltoid laterally.  Anterior circumflex humeral vessels were clamped and   coagulated.  The soft tissues overlying the biceps was incised and this   incision was carried across the transverse humeral ligament to the base   of the coracoid.  The biceps was noted to be severely degenerated. It was released from the superior labrum. The biceps was then tenodesed to the soft tissue just above   pectoralis major and the remaining portion of the biceps superiorly was   excised.  An osteotomy was performed at the lesser tuberosity.  The capsule was then   released all the way down to the 6 o'clock position of the  humeral head.   The humeral head was then delivered with simultaneous adduction,   extension and external rotation.  All humeral osteophytes were removed   and the anatomic neck of the humerus was marked and cut free hand at   approximately 25 degrees retroversion within about 3 mm of the cuff   reflection posteriorly.  The head size was estimated to be a 43 medium   offset.  At that point, the humeral head was retracted posteriorly with   a Fukuda retractor.   Remaining portion of the capsule was released at the base of the   coracoid.  The remaining biceps anchor and the entire anterior-inferior   labrum was excised.  The posterior labrum was also excised but the   posterior capsule was not released.  The guidepin was placed bicortically with NON elevated guide.  The reamer was used to ream to concentric bone with punctate bleeding.  This gave an excellent concentric surface.  The center hole was then drilled for an anchor peg glenoid followed by the three peripheral holes and none of the holes   exited the glenoid wall.  I then pulse irrigated these holes and dried   them with Surgicel.  The three peripheral holes were then   pressurized cemented and the anchor peg glenoid was placed and impacted   with an excellent fit.  The glenoid was a 40 small component.  The proximal humerus was then again exposed taking care not to displace the glenoid.    The entry awl was used followed by sounding reamers and then sequentially broached for a size 1 stem.  Her canal was very small and just barely fit the smallest stem.. This was then left in place and the calcar planer was used. Trial head was placed with a 43.  With the trial implantation of the component,  there was approximately 50% posterior translation with immediate snap back to the   anatomic position.  With forward elevation, there was no tendency   towards posterior subluxation.   The trial was removed and the final implant was prepared on a  back table.  The trial was removed and the final implant was prepared on a back table.   3 small holes were drilled on the medial side of the lesser tuberosity osteotomy, through which 2 labral tapes were passed. The implant was then placed through the loop of the 2 labral tapes and impacted with an excellent press-fit. This achieved excellent anatomic reconstruction of the proximal humerus.  The joint was then copiously irrigated with pulse lavage.  The subscapularis and   lesser tuberosity osteotomy were then repaired using the 2 labral tapes previously passed in a double row fashion with horizontal mattress sutures medially brought over through bone tunnels tied over a bone bridge laterally.   One #1 Ethibond was placed at the rotator interval just above   the lesser tuberosity. Copious  irrigation was used. Skin was closed with 2-0 Vicryl sutures in the deep dermal layer and 4-0 Monocryl in a subcuticular  running fashion.  Sterile dressings were then applied including Aquacel.  The patient was placed in a sling and allowed to awaken from general anesthesia and taken to the recovery room in stable condition.      POSTOPERATIVE PLAN:  Early passive range of motion will be allowed with the goal of 0 degrees external rotation and 90 degrees forward elevation.  No internal rotation at this time.  No active motion of the arm until the lesser tuberosity heals.  The patient will likely be kept in the hospital overnight for observation.Marland Kitchen

## 2020-08-13 NOTE — Discharge Instructions (Signed)
Discharge Instructions after Total Shoulder Arthroplasty   . A sling has been provided for you. Remove the sling 5 times each day to perform motion exercises.  . Use ice on the shoulder intermittently over the first 48 hours after surgery.  . Pain medication has been prescribed for you.  . Use your medication liberally over the first 48 hours, and then begin to taper your use. You may take Extra Strength Tylenol or Tylenol only in place of the pain pills. DO NOT take ANY nonsteroidal anti-inflammatory pain medications: Advil, Motrin, Ibuprofen, Aleve, Naproxen, or Naprosyn. . Take one aspirin a day for 2 weeks after surgery, unless you have an aspirin sensitivity/allergy or asthma. . Leave your dressing on until your first follow up visit.  You may shower with the dressing.  Hold your arm as if you still have your sling on while you shower. . Active reaching and lifting are not permitted. You may use the operative arm for activities of daily living that do not require the operative arm to leave the side of the body, such as eating, drinking, bathing, etc.  . Three to 5 times each day you should perform assisted overhead reaching and external rotation (outward turning) exercises with the operative arm. You were taught these exercises prior to discharge. Both exercises should be done with the non-operative arm used as the "therapist arm" while the operative arm remains relaxed. Ten of each exercise should be done three to five times each day.   Overhead reach is helping to lift your stiff arm up as high as it will go. To stretch your overhead reach, lie flat on your back, relax, and grasp the wrist of the tight shoulder with your opposite hand. Using the power in your opposite arm, bring the stiff arm up as far as it is comfortable. Start holding it for ten seconds and then work up to where you can hold it for a count of 30. Breathe slowly and deeply while the arm is moved. Repeat this stretch ten times,  trying to help the ar up a little higher each time.     External rotation is turning the arm out to the side while your elbow stays close to your body. External rotation is best stretched while you are lying on your back. Hold a cane, yardstick, broom handle, or dowel in both hands. Bend both elbows to a right angle. Use steady, gentle force from your normal arm to rotate the hand of the stiff shoulder out away from your body. Continue the rotation until it is straight in front of you holding it there for a count of 10. Do not go beyond this level of rotation until seen back by Dr. Tamera Punt. Repeat this exercise ten times slowly.      Please call [phone] during normal business hours or [phone] after hours for any problems. Including the following:  - excessive redness of the incisions - drainage for more than 4 days - fever of more than 101.5 F  *Please note that pain medications will not be refilled after hours or on weekends.  Dental Antibiotics:  In most cases prophylactic antibiotics for Dental procdeures after total joint surgery are not necessary.  Exceptions are as follows:  1. History of prior total joint infection  2. Severely immunocompromised (Organ Transplant, cancer chemotherapy, Rheumatoid biologic meds such as Gladstone)  3. Poorly controlled diabetes (A1C &gt; 8.0, blood glucose over 200)  If you have one of these conditions, contact your surgeon  for an antibiotic prescription, prior to your dental procedure.   DIET:  As you were doing prior to hospitalization, we recommend a well-balanced diet  CONSTIPATION  Constipation is defined medically as fewer than three stools per week and severe constipation as less than one stool per week.  Even if you have a regular bowel pattern at home, your normal regimen is likely to be disrupted due to multiple reasons following surgery.  Combination of anesthesia, postoperative narcotics, change in appetite and fluid  intake all can affect your bowels.   YOU MUST use at least one of the following options; they are listed in order of increasing strength to get the job done.  They are all available over the counter, and you may need to use some, POSSIBLY even all of these options:    Drink plenty of fluids (prune juice may be helpful) and high fiber foods Colace 100 mg by mouth twice a day  Senokot for constipation as directed and as needed Dulcolax (bisacodyl), take with full glass of water  Miralax (polyethylene glycol) once or twice a day as needed.  If you have tried all these things and are unable to have a bowel movement in the first 3-4 days after surgery call either your surgeon or your primary doctor.    If you experience loose stools or diarrhea, hold the medications until you stool forms back up.  If your symptoms do not get better within 1 week or if they get worse, check with your doctor.  If you experience "the worst abdominal pain ever" or develop nausea or vomiting, please contact the office immediately for further recommendations for treatment.   ITCHING:  If you experience itching with your medications, try taking only a single pain pill, or even half a pain pill at a time.  You can also use Benadryl over the counter for itching or also to help with sleep.   MEDICATIONS:  See your medication summary on the "After Visit Summary" that nursing will review with you.  You may have some home medications which will be placed on hold until you complete the course of blood thinner medication.  It is important for you to complete the blood thinner medication as prescribed.  PRECAUTIONS:  If you experience chest pain or shortness of breath - call 911 immediately for transfer to the hospital emergency department.   If you develop a fever greater that 101 F, purulent drainage from wound, increased redness or drainage from wound, foul odor from the wound/dressing, or calf pain - CONTACT YOUR SURGEON.      MAKE SURE YOU:  . Understand these instructions.  . Get help right away if you are not doing well or get worse.    Thank you for letting us be a part of your medical care team.  It is a privilege we respect greatly.  We hope these instructions will help you stay on track for a fast and full recovery!

## 2020-08-13 NOTE — H&P (Signed)
Diana Soto is an 60 y.o. female.   Chief Complaint:  R shoulder pain and dysfunction HPI: Endstage R shoulder arthritis with significant pain and dysfunction, failed conservative measures.  Pain interferes with sleep and quality of life.   Past Medical History:  Diagnosis Date  . Arthritis   . Glaucoma   . Hypertension   . Stroke [facility])     Past Surgical History:  Procedure Laterality Date  . COLONOSCOPY  12/2019  . EYE SURGERY      History reviewed. No pertinent family history. Social History:  reports that she quit smoking about 3 years ago. She smoked 1.00 pack per day. She has never used smokeless tobacco. She reports previous alcohol use. She reports that she does not use drugs.  Allergies:  Allergies  Allergen Reactions  . Lisinopril     angioedema    Medications Prior to Admission  Medication Sig Dispense Refill  . amLODipine (NORVASC) 10 MG tablet Take 10 mg by mouth daily.    . carvedilol (COREG) 12.5 MG tablet Take 12.5 mg by mouth 2 (two) times daily.    . ferrous gluconate (FERGON) 324 MG tablet Take 658 mg by mouth daily.    . hydrochlorothiazide (MICROZIDE) 12.5 MG capsule Take 12.5 mg by mouth daily.    Marland Kitchen ibuprofen (ADVIL) 200 MG tablet Take 600 mg by mouth every 6 (six) hours as needed for mild pain.    Marland Kitchen METHADONE HCL PO Take 40 mg by mouth daily.    Marland Kitchen spironolactone (ALDACTONE) 25 MG tablet Take 25 mg by mouth daily.      Results for orders placed or performed during the hospital encounter of 08/13/20 (from the past 48 hour(s))  ABO/Rh     Status: None (Preliminary result)   Collection Time: 08/13/20  8:15 AM  Result Value Ref Range   ABO/RH(D)      O POS Performed at [facility], [place] [address], [place], [place] [address]    DG Chest 2 View  Result Date: 08/11/2020 CLINICAL DATA:  Preoperative assessment for right shoulder surgery EXAM: CHEST - 2 VIEW COMPARISON:  None. FINDINGS: Frontal and lateral views of the chest  demonstrate mild enlargement the cardiac silhouette. The thoracic aorta is ectatic. No airspace disease, effusion, or pneumothorax. 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 No acute bony abnormalities. IMPRESSION: 1. No acute intrathoracic process. Electronically Signed   By: Randa Ngo M.D.   On: 08/11/2020 20:00   CT SHOULDER RIGHT WO CONTRAST  Result Date: 08/12/2020 CLINICAL DATA:  Preop for total right shoulder arthroplasty. EXAM: CT OF THE UPPER RIGHT EXTREMITY WITHOUT CONTRAST TECHNIQUE: Multidetector CT imaging of the upper right extremity was performed according to the standard protocol. COMPARISON:  None. FINDINGS: Severe glenohumeral joint degenerative changes with full-thickness cartilage loss, marked joint space narrowing (bone-on-bone appearance), osteophytic spurring, bony eburnation and subchondral cystic change. There are also several large loose ossified bodies in the axillary recess and subcoracoid bursa. No fracture or AVN. The [facility] joint is intact. The acromion is type 2-3 in shape. No lateral downsloping or subacromial spurring. The visualized right ribs are intact and the visualized right lung is grossly clear. No worrisome pulmonary lesions. Mild emphysematous changes are noted. Grossly by CT the rotator cuff tendons are intact. No fatty atrophy of the rotator cuff muscles. IMPRESSION: 1. Severe glenohumeral joint degenerative changes. 2. Several large loose ossified bodies in the axillary recess and subcoracoid bursa. 3. Grossly by CT the rotator  cuff tendons are intact. Electronically Signed   By: Marijo Sanes M.D.   On: 08/12/2020 08:53    Review of Systems  All other systems reviewed and are negative.   Blood pressure (!) 133/97, pulse 75, temperature 98.3 F (36.8 C), temperature source Oral, resp. rate 18, weight 92.6 kg, SpO2 99 %. Physical Exam HENT:     Head: Atraumatic.  Eyes:     Extraocular Movements: Extraocular movements intact.   Cardiovascular:     Pulses: Normal pulses.  Pulmonary:     Effort: Pulmonary effort is normal.  Musculoskeletal:     Comments: Right shoulder pain with limited ROM. NVID.  Neurological:     Mental Status: She is alert.  Psychiatric:        Mood and Affect: Mood normal.      Assessment/Plan R shoulder endstage arthritis Plan R TSA Risks / benefits of surgery discussed Consent on chart  NPO for OR Preop antibiotics   Isabella Stalling, MD 08/13/2020, 8:58 AM

## 2020-08-13 NOTE — Progress Notes (Signed)
AssistedDr. Jana Half with right, ultrasound guided, interscalene  block. Side rails up, monitors on throughout procedure. See vital signs in flow sheet. Tolerated Procedure well.

## 2020-08-13 NOTE — Anesthesia Procedure Notes (Addendum)
Anesthesia Regional Block: Interscalene brachial plexus block   Pre-Anesthetic Checklist: ,, timeout performed, Correct Patient, Correct Site, Correct Laterality, Correct Procedure, Correct Position, site marked, Risks and benefits discussed,  Surgical consent,  Pre-op evaluation,  At surgeon's request and post-op pain management  Laterality: Right  Prep: Dura Prep       Needles:  Injection technique: Single-shot  Needle Type: Echogenic Stimulator Needle     Needle Length: 5cm  Needle Gauge: 20     Additional Needles:   Procedures:,,,, ultrasound used (permanent image in chart),,,,  Narrative:  Start time: 08/13/2020 8:42 AM End time: 08/13/2020 8:48 AM Injection made incrementally with aspirations every 5 mL.  Performed by: Personally  Anesthesiologist: Darral Dash, DO  Additional Notes: Patient identified. Risks/Benefits/Options discussed with patient including but not limited to bleeding, infection, nerve damage, failed block, incomplete pain control. Patient expressed understanding and wished to proceed. All questions were answered. Sterile technique was used throughout the entire procedure. Please see nursing notes for vital signs. Aspirated in 5cc intervals with injection for negative confirmation. Patient was given instructions on fall risk and not to get out of bed. All questions and concerns addressed with instructions to call with any issues or inadequate analgesia.     - Patient poorly cooperative despite sedation with inability to sit still.

## 2020-08-13 NOTE — Anesthesia Procedure Notes (Signed)
Procedure Name: Intubation Date/Time: 08/13/2020 10:00 AM Performed by: Claudia Desanctis, CRNA Pre-anesthesia Checklist: Patient identified, Emergency Drugs available, Suction available and Patient being monitored Patient Re-evaluated:Patient Re-evaluated prior to induction Oxygen Delivery Method: Circle system utilized Preoxygenation: Pre-oxygenation with 100% oxygen Induction Type: IV induction Ventilation: Mask ventilation without difficulty Laryngoscope Size: 2 and Miller Grade View: Grade I Tube type: Oral Tube size: 7.0 mm Number of attempts: 1 Airway Equipment and Method: Stylet Placement Confirmation: ETT inserted through vocal cords under direct vision,  positive ETCO2 and breath sounds checked- equal and bilateral Secured at: 21 cm Tube secured with: Tape Dental Injury: Teeth and Oropharynx as per pre-operative assessment  Comments: Multiple missing and broken teeth per preop exam,   Unchanged after dl

## 2020-08-13 NOTE — Transfer of Care (Signed)
Immediate Anesthesia Transfer of Care Note  Patient: Diana Soto  Procedure(s) Performed: TOTAL SHOULDER ARTHROPLASTY (Right Shoulder)  Patient Location: PACU  Anesthesia Type:GA combined with regional for post-op pain  Level of Consciousness: awake, alert , oriented and patient cooperative  Airway & Oxygen Therapy: Patient Spontanous Breathing and Patient connected to face mask  Post-op Assessment: Report given to RN and Post -op Vital signs reviewed and stable  Post vital signs: Reviewed and stable  Last Vitals:  Vitals Value Taken Time  BP 148/105 08/13/20 1139  Temp    Pulse 86 08/13/20 1141  Resp 14 08/13/20 1141  SpO2 99 % 08/13/20 1141  Vitals shown include unvalidated device data.  Last Pain:  Vitals:   08/13/20 0850  TempSrc:   PainSc: 0-No pain         Complications: No complications documented.

## 2020-08-14 ENCOUNTER — Encounter (HOSPITAL_COMMUNITY): Payer: Self-pay | Admitting: Orthopedic Surgery

## 2020-08-14 DIAGNOSIS — M19011 Primary osteoarthritis, right shoulder: Secondary | ICD-10-CM | POA: Diagnosis not present

## 2020-08-14 MED ORDER — TIZANIDINE HCL 4 MG PO TABS: 4.0000 mg | ORAL_TABLET | Freq: Three times a day (TID) | ORAL | 1 refills | Status: AC | PRN

## 2020-08-14 NOTE — Discharge Summary (Signed)
Patient ID: Diana Soto MRN: [id number] DOB/AGE: November 16, 1960 60 y.o.  Admit date: 08/13/2020 Discharge date: 08/14/2020  Admission Diagnoses:  Active Problems:   S/P arthroscopy of right shoulder   Discharge Diagnoses:  Same  Past Medical History:  Diagnosis Date  . Arthritis   . Glaucoma   . Hypertension   . Stroke [facility])     Surgeries: Procedure(s): TOTAL SHOULDER ARTHROPLASTY on 08/13/2020   Consultants:   Discharged Condition: Improved  Hospital Course: Diana Soto is an 60 y.o. female who was admitted 08/13/2020 for operative treatment of right shoulder OA. Patient has severe unremitting pain that affects sleep, daily activities, and work/hobbies. After pre-op clearance the patient was taken to the operating room on 08/13/2020 and underwent  Procedure(s): TOTAL SHOULDER ARTHROPLASTY.    Patient was given perioperative antibiotics:  Anti-infectives (From admission, onward)   Start     Dose/Rate Route Frequency Ordered Stop   08/13/20 0745  ceFAZolin (ANCEF) IVPB 2g/100 mL premix        2 g 200 mL/hr over 30 Minutes Intravenous On call to O.R. 08/13/20 0740 08/13/20 1002       Patient was given sequential compression devices, early ambulation, and asa to prevent DVT.  Patient benefited maximally from hospital stay and there were no complications.    Recent vital signs:  Patient Vitals for the past 24 hrs:  BP Temp Temp src Pulse Resp SpO2 Height Weight  08/14/20 0559 (!) 144/89 99.2 F (37.3 C) Oral 87 18 99 % -- --  08/14/20 0105 123/78 99.5 F (37.5 C) Oral 94 20 98 % -- --  08/13/20 2041 (!) 149/96 98.4 F (36.9 C) Oral 87 19 98 % -- --  08/13/20 1344 (!) 145/109 98.2 F (36.8 C) Oral 82 18 97 % 5' (1.524 m) 92.6 kg  08/13/20 1315 (!) 144/104 -- -- 82 16 94 % -- --  08/13/20 1300 131/90 -- -- 79 13 95 % -- --  08/13/20 1245 131/84 -- -- 79 13 95 % -- --  08/13/20 1230 (!) 135/94 -- -- 82 14 92 % -- --  08/13/20 1215 (!) 136/100 -- -- 80 14  100 % -- --  08/13/20 1200 -- -- -- 80 -- 100 % -- --  08/13/20 1145 (!) 146/93 -- -- 85 16 99 % -- --  08/13/20 1140 (!) 148/105 97.9 F (36.6 C) -- 91 15 99 % -- --     Recent laboratory studies: No results for input(s): WBC, HGB, HCT, PLT, NA, K, CL, CO2, BUN, CREATININE, GLUCOSE, INR, CALCIUM in the last 72 hours.  Invalid input(s): PT, 2   Discharge Medications:   Allergies as of 08/14/2020      Reactions   Lisinopril    angioedema      Medication List    TAKE these medications   amLODipine 10 MG tablet Commonly known as: NORVASC Take 10 mg by mouth daily.   carvedilol 12.5 MG tablet Commonly known as: COREG Take 12.5 mg by mouth 2 (two) times daily.   ferrous gluconate 324 MG tablet Commonly known as: FERGON Take 658 mg by mouth daily.   hydrochlorothiazide 12.5 MG capsule Commonly known as: MICROZIDE Take 12.5 mg by mouth daily.   ibuprofen 200 MG tablet Commonly known as: ADVIL Take 600 mg by mouth every 6 (six) hours as needed for mild pain.   METHADONE HCL PO Take 40 mg by mouth daily.   spironolactone 25 MG tablet Commonly known as:  ALDACTONE Take 25 mg by mouth daily.   tiZANidine 4 MG tablet Commonly known as: Zanaflex Take 1 tablet (4 mg total) by mouth every 8 (eight) hours as needed for muscle spasms.       Diagnostic Studies: DG Chest 2 View  Result Date: 08/11/2020 CLINICAL DATA:  Preoperative assessment for right shoulder surgery EXAM: CHEST - 2 VIEW COMPARISON:  None. FINDINGS: Frontal and lateral views of the chest demonstrate mild enlargement the cardiac silhouette. The thoracic aorta is ectatic. No airspace disease, effusion, or pneumothorax. 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 No acute bony abnormalities. IMPRESSION: 1. No acute intrathoracic process. Electronically Signed   By: Randa Ngo M.D.   On: 08/11/2020 20:00   CT SHOULDER RIGHT WO CONTRAST  Result Date: 08/12/2020 CLINICAL DATA:  Preop for  total right shoulder arthroplasty. EXAM: CT OF THE UPPER RIGHT EXTREMITY WITHOUT CONTRAST TECHNIQUE: Multidetector CT imaging of the upper right extremity was performed according to the standard protocol. COMPARISON:  None. FINDINGS: Severe glenohumeral joint degenerative changes with full-thickness cartilage loss, marked joint space narrowing (bone-on-bone appearance), osteophytic spurring, bony eburnation and subchondral cystic change. There are also several large loose ossified bodies in the axillary recess and subcoracoid bursa. No fracture or AVN. The [facility] joint is intact. The acromion is type 2-3 in shape. No lateral downsloping or subacromial spurring. The visualized right ribs are intact and the visualized right lung is grossly clear. No worrisome pulmonary lesions. Mild emphysematous changes are noted. Grossly by CT the rotator cuff tendons are intact. No fatty atrophy of the rotator cuff muscles. IMPRESSION: 1. Severe glenohumeral joint degenerative changes. 2. Several large loose ossified bodies in the axillary recess and subcoracoid bursa. 3. Grossly by CT the rotator cuff tendons are intact. Electronically Signed   By: Marijo Sanes M.D.   On: 08/12/2020 08:53   DG Shoulder Right Port  Result Date: 08/13/2020 CLINICAL DATA:  Postop right shoulder arthroplasty. EXAM: PORTABLE RIGHT SHOULDER COMPARISON:  CT scan 08/11/2020 FINDINGS: The total shoulder arthroplasty components appear well seated. No complicating features are identified. IMPRESSION: Well seated components of a total shoulder arthroplasty. Electronically Signed   By: Marijo Sanes M.D.   On: 08/13/2020 13:57    Disposition: Discharge disposition: 01-Home or Self Care       Discharge Instructions    Call MD / Call 911   Complete by: As directed    If you experience chest pain or shortness of breath, CALL 911 and be transported to the hospital emergency room.  If you develope a fever above 101 F, pus (white drainage) or increased  drainage or redness at the wound, or calf pain, call your surgeon's office.   Constipation Prevention   Complete by: As directed    Drink plenty of fluids.  Prune juice may be helpful.  You may use a stool softener, such as Colace (over the counter) 100 mg twice a day.  Use MiraLax (over the counter) for constipation as needed.   Diet - low sodium heart healthy   Complete by: As directed    Increase activity slowly as tolerated   Complete by: As directed    Post-operative opioid taper instructions:   Complete by: As directed    POST-OPERATIVE OPIOID TAPER INSTRUCTIONS: It is important to wean off of your opioid medication as soon as possible. If you do not need pain medication after your surgery it is ok to stop day one. Opioids include: Codeine, Hydrocodone(Norco, Vicodin), Oxycodone(Percocet, oxycontin) and  hydromorphone amongst others.  Long term and even short term use of opiods can cause: Increased pain response Dependence Constipation Depression Respiratory depression And more.  Withdrawal symptoms can include Flu like symptoms Nausea, vomiting And more Techniques to manage these symptoms Hydrate well Eat regular healthy meals Stay active Use relaxation techniques(deep breathing, meditating, yoga) Do Not substitute Alcohol to help with tapering If you have been on opioids for less than two weeks and do not have pain than it is ok to stop all together.  Plan to wean off of opioids This plan should start within one week post op of your joint replacement. Maintain the same interval or time between taking each dose and first decrease the dose.  Cut the total daily intake of opioids by one tablet each day Next start to increase the time between doses. The last dose that should be eliminated is the evening dose.          Follow-up Information    Tania Ade, MD. Schedule an appointment as soon as possible for a visit in 2 weeks.   Specialty: Orthopedic  Surgery Contact information: [address] [place] [address] [phone]                Signed: Grier Mitts 08/14/2020, 10:00 AM

## 2020-08-14 NOTE — Progress Notes (Signed)
Occupational Therapy Treatment  OT returned to patient's room once family member present. Upon arrival patient already dressed and when asked how patient sequenced this with CNA reports did not dress operated side first. Re-educated/reinforced with patient and son in law how to sequence UB dressing. Provided all education to patient's son in law who will also pass along to his spouse (patient's daughter) regarding exercise program, ice machine management + set up on shoulder, and all other compensatory strategies to assist with ADLs at home. Son in law very familiar with sling application due to his own history of broken arms. Son in law verbalize understanding and provided with the handouts, reports confident in being able to tell his spouse how to assist patient at home. No further acute OT needs at this time, patient to follow up with MD regarding further therapy needs at D/C.     08/14/20 1305  OT Visit Information  Last OT Received On 08/14/20  Assistance Needed +1  History of Present Illness Patient is a 60 year old female admitted 4/21 for R total shoulder arthroplasty. PMH includes CVA, gluacoma  Precautions  Precautions Shoulder  Type of Shoulder Precautions AROM elbow, wrist, hand ok. PROM shoulder FF 0-90, ER to neutral (0 degree) NO abduction  Shoulder Interventions Shoulder sling/immobilizer;Off for dressing/bathing/exercises  Precaution Booklet Issued Yes (comment)  Required Braces or Orthoses Sling  Pain Assessment  Pain Assessment Faces  Faces Pain Scale 2  Pain Location R shoulder  Pain Descriptors / Indicators Sore  Pain Intervention(s) Limited activity within patient's tolerance  Cognition  Arousal/Alertness Awake/alert  Behavior During Therapy WFL for tasks assessed/performed  Overall Cognitive Status Within Functional Limits for tasks assessed  ADL  General ADL Comments upon arrival to patient's room patient eating her lunch and is already dressed. Asked patient/CNA  how they performed getting dressed and patient did not recall dressing operated extremity first. Re-educated/reinforced with patient and patient's son in law. Educated son in law how to assist patient with ADLs at home while maintaining precautions, verbalizes understanding and will inform his spouse (patient's daughter) on how to assist as well.  Bed Mobility  General bed mobility comments in chair  Restrictions  Weight Bearing Restrictions Yes  RUE Weight Bearing NWB  Exercises  Exercises Shoulder  Shoulder Instructions  Donning/doffing shirt without moving shoulder Caregiver independent with task  Method for sponge bathing under operated UE Caregiver independent with task  Donning/doffing sling/immobilizer Caregiver independent with task  Correct positioning of sling/immobilizer Caregiver independent with task  Pendulum exercises (written home exercise program)  (N/A)  ROM for elbow, wrist and digits of operated UE Caregiver independent with task  Sling wearing schedule (on at all times/off for ADL's) Caregiver independent with task  Proper positioning of operated UE when showering Caregiver independent with task  Dressing change  (N/A)  Positioning of UE while sleeping Caregiver independent with task  OT - End of Session  Equipment Utilized During Treatment Other (comment) (sling)  Activity Tolerance Patient tolerated treatment well  Patient left in chair;with call bell/phone within reach;with family/visitor present;with nursing/sitter in room  Nurse Communication Other (comment) (OT complete)  OT Assessment/Plan  OT Plan All goals met and education completed, patient discharged from OT services  OT Visit Diagnosis Pain  Pain - Right/Left Right  Pain - part of body Shoulder  Follow Up Recommendations Follow surgeon's recommendation for DC plan and follow-up therapies  OT Equipment 3 in 1 bedside commode  AM-PAC OT "6 Clicks" Daily Activity Outcome  Measure (Version 2)  Help  from another person eating meals? 1  Help from another person taking care of personal grooming? 3  Help from another person toileting, which includes using toliet, bedpan, or urinal? 2  Help from another person bathing (including washing, rinsing, drying)? 2  Help from another person to put on and taking off regular upper body clothing? 2  Help from another person to put on and taking off regular lower body clothing? 2  6 Click Score 12  OT Goal Progression  Progress towards OT goals Goals met/education completed, patient discharged from OT  Acute Rehab OT Goals  Patient Stated Goal less pain  OT Goal Formulation All assessment and education complete, DC therapy  OT Time Calculation  OT Start Time (ACUTE ONLY) 1230  OT Stop Time (ACUTE ONLY) 1245  OT Time Calculation (min) 15 min  OT General Charges  $OT Visit 1 Visit  OT Treatments  $Self Care/Home Management  8-22 mins   Delbert Phenix OT OT pager: [phone]

## 2020-08-14 NOTE — Evaluation (Signed)
Occupational Therapy Evaluation Patient Details Name: Diana Soto MRN: [id number] DOB: 03/22/1961 Today's Date: 08/14/2020    History of Present Illness Patient is a 60 year old female admitted 4/21 for R total shoulder arthroplasty. PMH includes CVA, gluacoma   Clinical Impression   Patient is a 60 year old female s/p shoulder replacement without functional use of right dominant upper extremity secondary to effects of surgery and interscalene block and shoulder precautions. Therapist provided education and instruction to patient in regards to exercises, precautions, positioning, donning upper extremity clothing and bathing while maintaining shoulder precautions, ice and edema management and donning/doffing sling. Patient very anxious regarding management of shoulder precautions and ADLs at home, repeatedly asking if OT will come back to educate family. Patient needing hand held assist x1 for transfers and max reassurance and cues for posture during ambulation due to significant R UE pain despite pre-medicated. OT will return when patient's family arrives for pick up to educate caregivers that will be assisting patient at D/C.      Follow Up Recommendations  Follow surgeon's recommendation for DC plan and follow-up therapies    Equipment Recommendations  3 in 1 bedside commode       Precautions / Restrictions Precautions Precautions: Shoulder Type of Shoulder Precautions: AROM elbow, wrist, hand ok. PROM shoulder FF 0-90, ER to neutral (0 degree) NO abduction Shoulder Interventions: Shoulder sling/immobilizer;Off for dressing/bathing/exercises Precaution Booklet Issued: Yes (comment) Required Braces or Orthoses: Sling Restrictions Weight Bearing Restrictions: Yes RUE Weight Bearing: Non weight bearing      Mobility Bed Mobility Overal bed mobility: Needs Assistance Bed Mobility: Supine to Sit     Supine to sit: Mod assist;HOB elevated     General bed mobility comments:  mod A to elevate trunk    Transfers Overall transfer level: Needs assistance Equipment used: 1 person hand held assist Transfers: Sit to/from Stand Sit to Stand: Min assist         General transfer comment: min hand held assist and cues to navigate room as patient is legally blind    Balance Overall balance assessment: Needs assistance Sitting-balance support: Feet supported Sitting balance-Leahy Scale: Fair     Standing balance support: Single extremity supported;No upper extremity supported Standing balance-Leahy Scale: Fair Standing balance comment: can static stand without UE support                           ADL either performed or assessed with clinical judgement   ADL Overall ADL's : Needs assistance/impaired Eating/Feeding: Total assistance;Sitting Eating/Feeding Details (indicate cue type and reason): upon arrival patient being fed by CNA, patient legally blind Grooming: Supervision/safety;Standing;Wash/dry hands Grooming Details (indicate cue type and reason): standing at sink Upper Body Bathing: Moderate assistance;Sitting   Lower Body Bathing: Moderate assistance;Sitting/lateral leans;Sit to/from stand   Upper Body Dressing : Maximal assistance Upper Body Dressing Details (indicate cue type and reason): educated patient in precautions and how to maintain during UB dressing Lower Body Dressing: Moderate assistance;Sit to/from stand;Sitting/lateral leans   Toilet Transfer: Minimal assistance;Ambulation;Regular Glass blower/designer Details (indicate cue type and reason): hand held assist x1 needs max cues to reassure patient due to pain and for posture as patient keeping flexed/forward posture Toileting- Clothing Manipulation and Hygiene: Moderate assistance;Sitting/lateral lean;Sit to/from stand       Functional mobility during ADLs: Minimal assistance General ADL Comments: patient educated in compensatory strategies for ADLs and how to maintain  during self care  Vision Baseline Vision/History: Glaucoma;Legally blind              Pertinent Vitals/Pain Pain Assessment: Faces Faces Pain Scale: Hurts whole lot Pain Location: R shoulder Pain Descriptors / Indicators: Moaning;Guarding;Grimacing;Aching Pain Intervention(s): Premedicated before session;Monitored during session     Hand Dominance Right   Extremity/Trunk Assessment Upper Extremity Assessment Upper Extremity Assessment: RUE deficits/detail RUE: Unable to fully assess due to immobilization;Unable to fully assess due to pain   Lower Extremity Assessment Lower Extremity Assessment: Overall WFL for tasks assessed       Communication Communication Communication: No difficulties   Cognition Arousal/Alertness: Awake/alert Behavior During Therapy: Restless;Anxious Overall Cognitive Status: No family/caregiver present to determine baseline cognitive functioning                                 General Comments: patient tangential needing cues to redirect to task, perseverating on pain. during education patient repeatedly asks "your going to show my family this too right?"      Exercises Exercises: Shoulder   Shoulder Instructions Shoulder Instructions Donning/doffing shirt without moving shoulder: Maximal assistance Method for sponge bathing under operated UE: Moderate assistance Donning/doffing sling/immobilizer: Maximal assistance Correct positioning of sling/immobilizer: Maximal assistance Pendulum exercises (written home exercise program):  (N/A) ROM for elbow, wrist and digits of operated UE: Maximal assistance Sling wearing schedule (on at all times/off for ADL's): Maximal assistance Proper positioning of operated UE when showering: Maximal assistance Dressing change:  (N/A) Positioning of UE while sleeping: Maximal assistance    Home Living Family/patient expects to be discharged to:: Private residence Living Arrangements:  Children;Other relatives Available Help at Discharge: Family;Available 24 hours/day Type of Home: House Home Access: Stairs to enter [employer] of Steps: 1   Home Layout: Two level;Bed/bath upstairs Alternate Level Stairs-Number of Steps: 17   Bathroom Shower/Tub: Tub/shower unit;Walk-in shower   Bathroom Toilet: Standard     Home Equipment: Other (comment) (vision cane)          Prior Functioning/Environment Level of Independence: Independent                 OT Problem List: Pain;Impaired UE functional use;Obesity;Decreased knowledge of precautions;Decreased safety awareness;Impaired balance (sitting and/or standing)      OT Treatment/Interventions: Self-care/ADL training;Therapeutic exercise;Patient/family education;Therapeutic activities    OT Goals(Current goals can be found in the care plan section) Acute Rehab OT Goals Patient Stated Goal: less pain OT Goal Formulation: With patient Time For Goal Achievement: 08/15/20 Potential to Achieve Goals: Good  OT Frequency:  (1 session)    AM-PAC OT "6 Clicks" Daily Activity     Outcome Measure Help from another person eating meals?: Total Help from another person taking care of personal grooming?: A Little Help from another person toileting, which includes using toliet, bedpan, or urinal?: A Lot Help from another person bathing (including washing, rinsing, drying)?: A Lot Help from another person to put on and taking off regular upper body clothing?: A Lot Help from another person to put on and taking off regular lower body clothing?: A Lot 6 Click Score: 12   End of Session Equipment Utilized During Treatment: Other (comment) (sling) Nurse Communication: Mobility status  Activity Tolerance: Patient limited by pain Patient left: in chair;with call bell/phone within reach;with chair alarm set  OT Visit Diagnosis: Pain;Unsteadiness on feet (R26.81) Pain - Right/Left: Right Pain - part of body:  Shoulder  [id number] OT Time Calculation (min): 38 min Charges:  OT General Charges $OT Visit: 1 Visit OT Evaluation $OT Eval Low Complexity: 1 Low OT Treatments $Self Care/Home Management : 23-37 mins  Delbert Phenix OT OT pager: [phone]  Rosemary Holms 08/14/2020, 1:02 PM

## 2020-08-14 NOTE — Plan of Care (Signed)
  Problem: Education: Goal: Knowledge of General Education information will improve Description Including pain rating scale, medication(s)/side effects and non-pharmacologic comfort measures Outcome: Progressing   

## 2020-08-14 NOTE — Anesthesia Postprocedure Evaluation (Signed)
Anesthesia Post Note  Patient: Diana Soto  Procedure(s) Performed: TOTAL SHOULDER ARTHROPLASTY (Right Shoulder)     Patient location during evaluation: PACU Anesthesia Type: Regional and General Level of consciousness: awake and alert Pain management: pain level controlled Vital Signs Assessment: post-procedure vital signs reviewed and stable Respiratory status: spontaneous breathing, nonlabored ventilation, respiratory function stable and patient connected to nasal cannula oxygen Cardiovascular status: blood pressure returned to baseline and stable Postop Assessment: no apparent nausea or vomiting Anesthetic complications: no   No complications documented.  Last Vitals:  Vitals:   08/14/20 0105 08/14/20 0559  BP: 123/78 (!) 144/89  Pulse: 94 87  Resp: 20 18  Temp: 37.5 C 37.3 C  SpO2: 98% 99%    Last Pain:  Vitals:   08/14/20 0559  TempSrc: Oral  PainSc:                  Diana Soto

## 2020-10-23 ENCOUNTER — Other Ambulatory Visit: Payer: Self-pay | Admitting: Family Medicine

## 2020-10-23 DIAGNOSIS — Z1231 Encounter for screening mammogram for malignant neoplasm of breast: Secondary | ICD-10-CM

## 2021-01-06 ENCOUNTER — Other Ambulatory Visit: Payer: Self-pay | Admitting: Family Medicine

## 2021-01-06 DIAGNOSIS — J181 Lobar pneumonia, unspecified organism: Secondary | ICD-10-CM

## 2021-06-22 ENCOUNTER — Other Ambulatory Visit: Payer: Self-pay | Admitting: Family Medicine

## 2021-06-22 DIAGNOSIS — Z1231 Encounter for screening mammogram for malignant neoplasm of breast: Secondary | ICD-10-CM

## 2021-06-24 ENCOUNTER — Other Ambulatory Visit: Payer: Self-pay | Admitting: Family Medicine

## 2021-06-24 DIAGNOSIS — E041 Nontoxic single thyroid nodule: Secondary | ICD-10-CM

## 2021-10-07 ENCOUNTER — Other Ambulatory Visit: Payer: Self-pay | Admitting: Family Medicine

## 2021-10-07 DIAGNOSIS — E041 Nontoxic single thyroid nodule: Secondary | ICD-10-CM

## 2022-03-03 IMAGING — CT CT SHOULDER*R* W/O CM
2 series · 13 of 27 positions shown, 16 images · non-contrast
Comparison: None.

CLINICAL DATA: Preop for total right shoulder arthroplasty.

EXAM:
CT OF THE UPPER RIGHT EXTREMITY WITHOUT CONTRAST
TECHNIQUE: Multidetector CT imaging of the upper right extremity was performed
according to the standard protocol.

[Series 2: shoulder 2.00 br40 s3 axial soft · axial · 0.50mm/px · z∈[-1084,-938]mm · 8 of 87 slices shown, 10 images]
[im 7/87  soft-tissue]
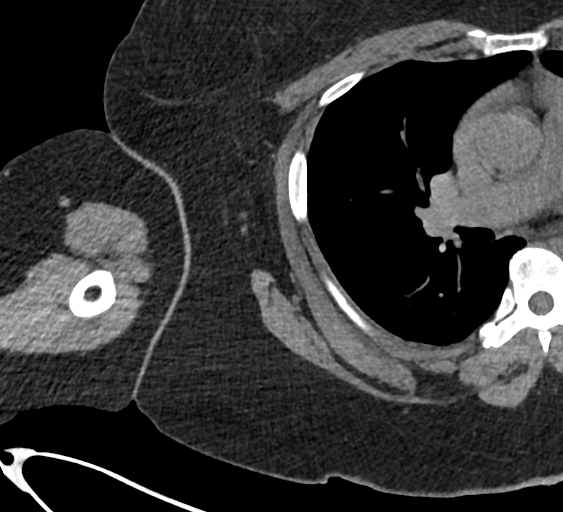
[im 7/87  bone]
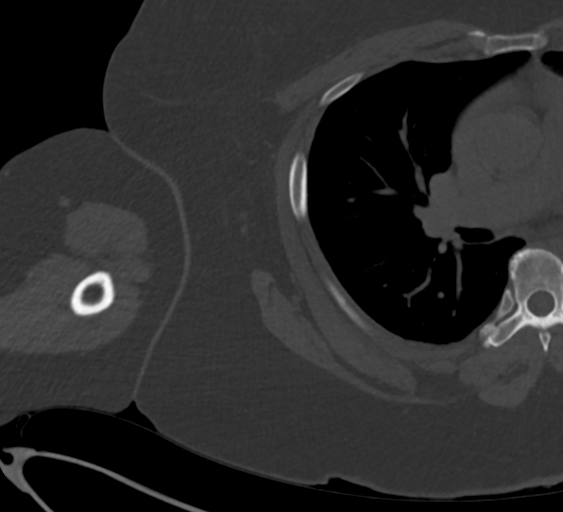
[im 20/87  bone]
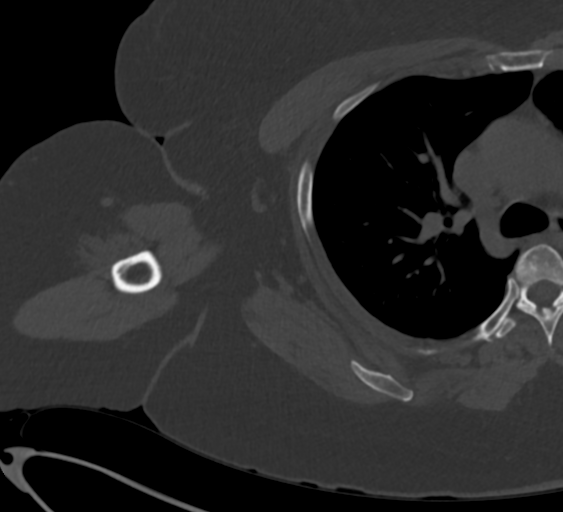
[im 27/87  bone]
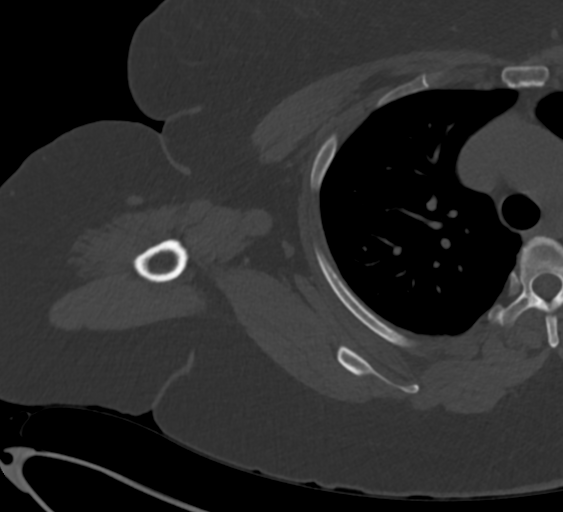
[im 40/87  bone]
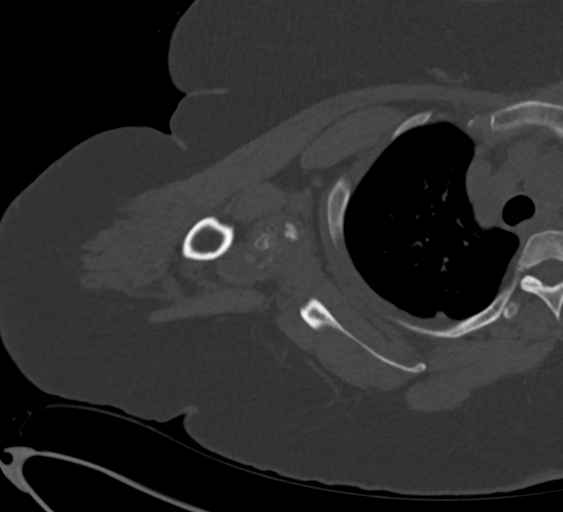
[im 47/87  soft-tissue]
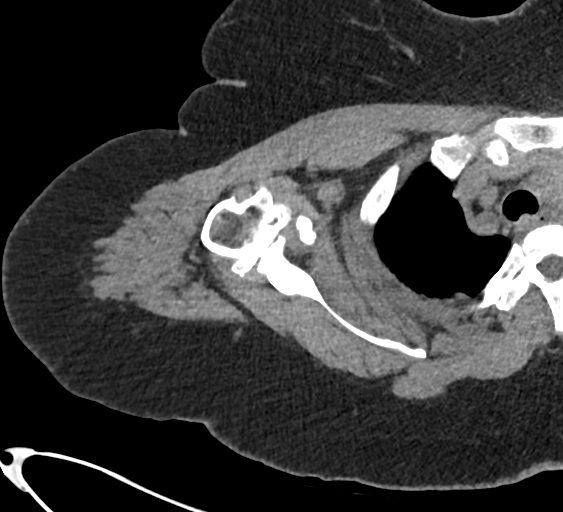
[im 47/87  bone]
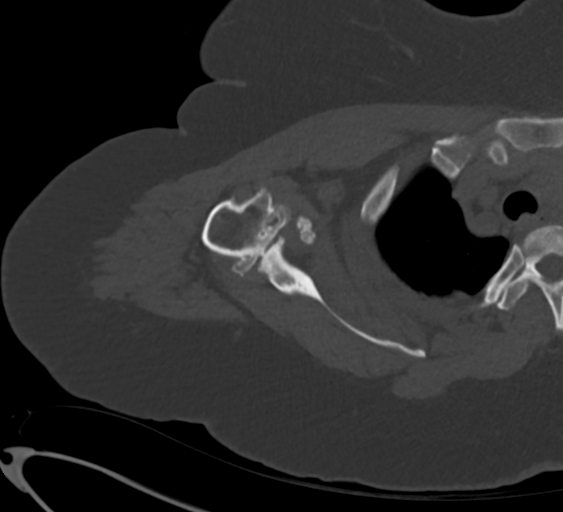
[im 60/87  bone]
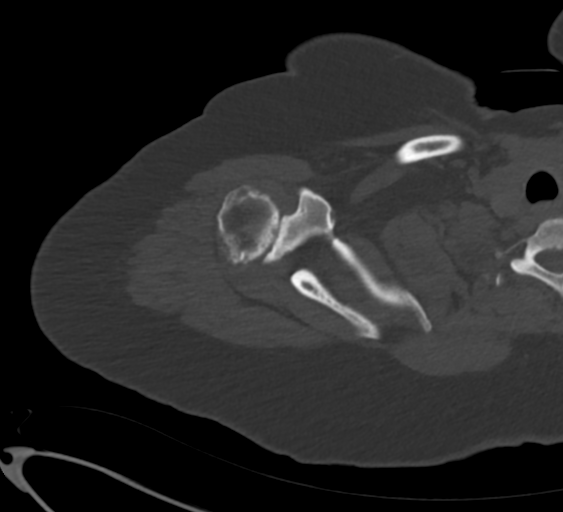
[im 67/87  bone]
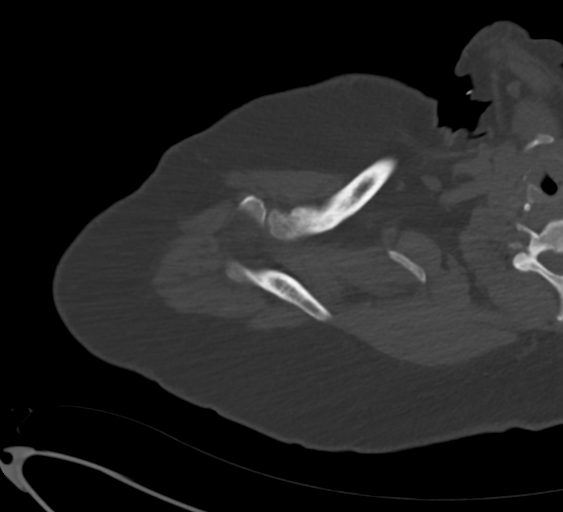
[im 80/87  bone]
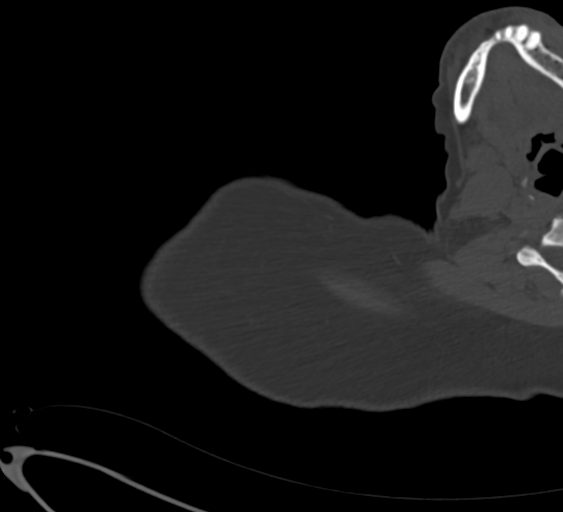

[Series 10: shoulder 2.00 br40 s3 sag soft · sagittal · 0.34mm/px · 5 of 141 slices shown, 6 images]
[im 47/141  bone]
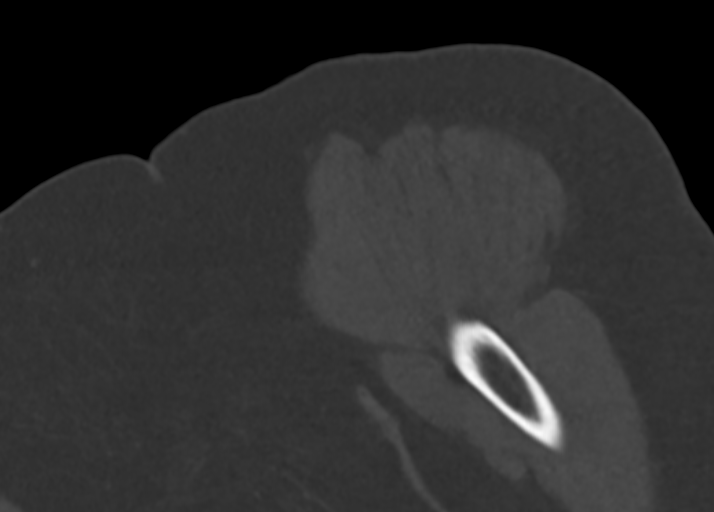
[im 59/141  bone]
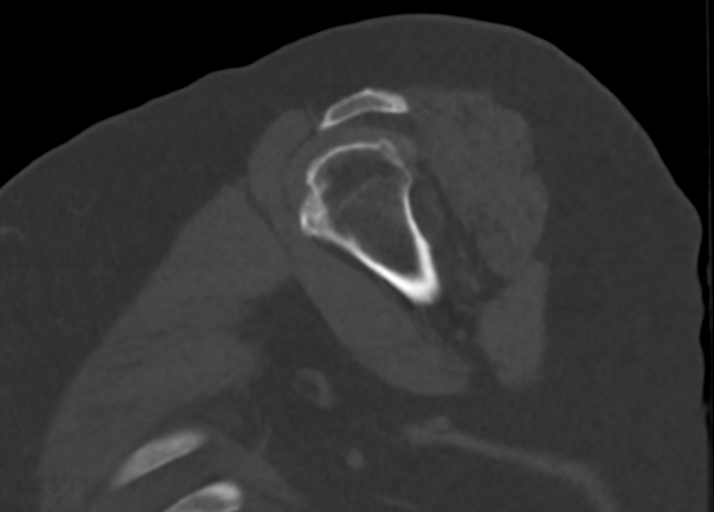
[im 71/141  soft-tissue]
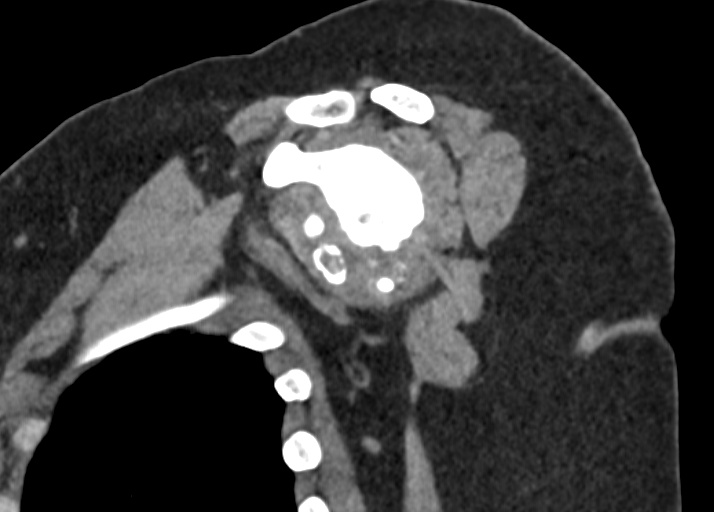
[im 71/141  bone]
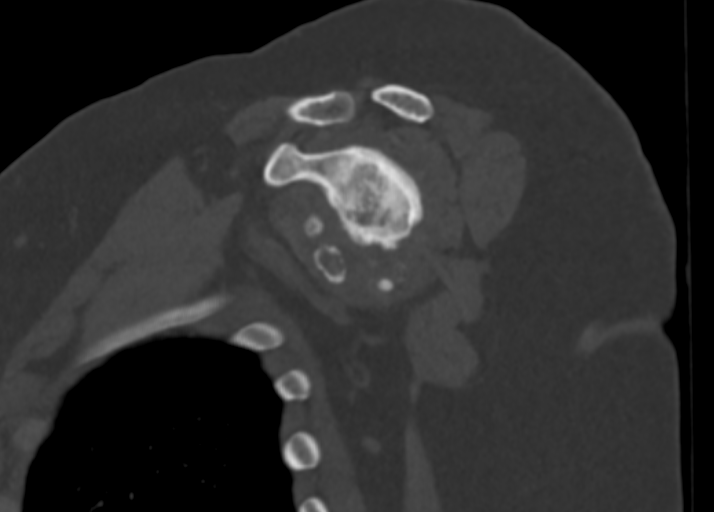
[im 82/141  bone]
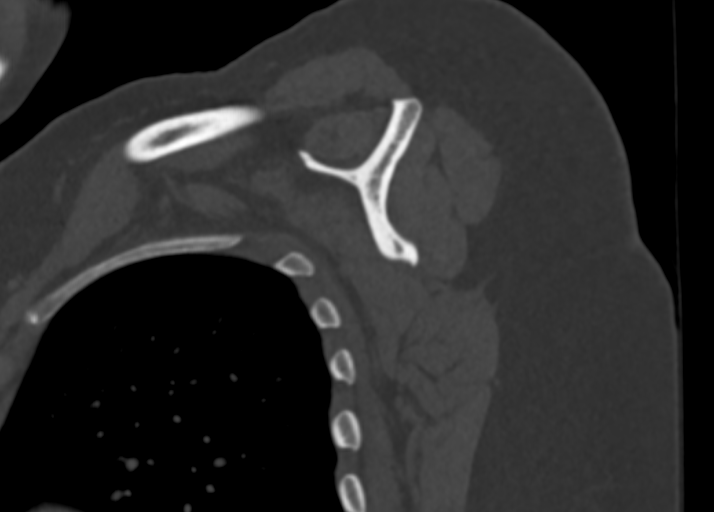
[im 94/141  bone]
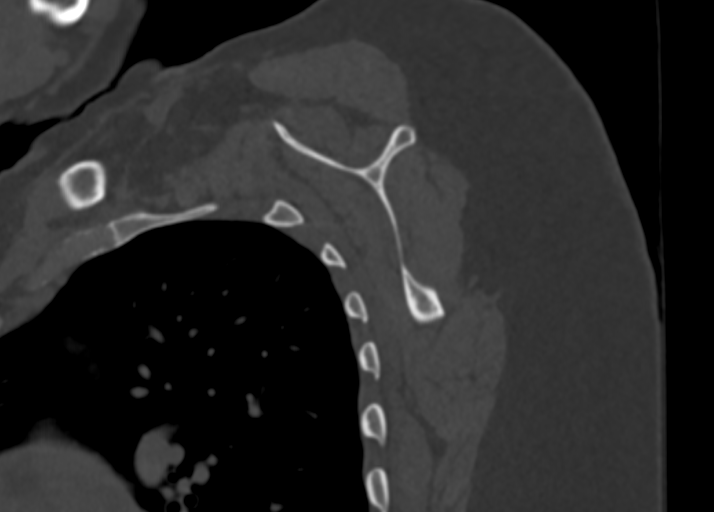

[13 of 27 positions shown; findings below may reference images not displayed]

FINDINGS: Severe glenohumeral joint degenerative changes with full-thickness
cartilage loss, marked joint space narrowing (bone-on-bone
appearance), osteophytic spurring, bony eburnation and subchondral
cystic change. There are also several large loose ossified bodies in
the axillary recess and subcoracoid bursa. No fracture or AVN.

The AC joint is intact. The acromion is type 2-3 in shape. No
lateral downsloping or subacromial spurring.

The visualized right ribs are intact and the visualized right lung
is grossly clear. No worrisome pulmonary lesions. Mild emphysematous
changes are noted.

Grossly by CT the rotator cuff tendons are intact. No fatty atrophy
of the rotator cuff muscles.
IMPRESSION: 1. Severe glenohumeral joint degenerative changes.
2. Several large loose ossified bodies in the axillary recess and
subcoracoid bursa.
3. Grossly by CT the rotator cuff tendons are intact.

## 2022-03-05 IMAGING — DX DG SHOULDER 2+V PORT*R*
1 series · 1 of 1 positions shown · non-contrast
Comparison: CT scan 08/11/2020

CLINICAL DATA: Postop right shoulder arthroplasty.

EXAM:
PORTABLE RIGHT SHOULDER

[shoulder ap]
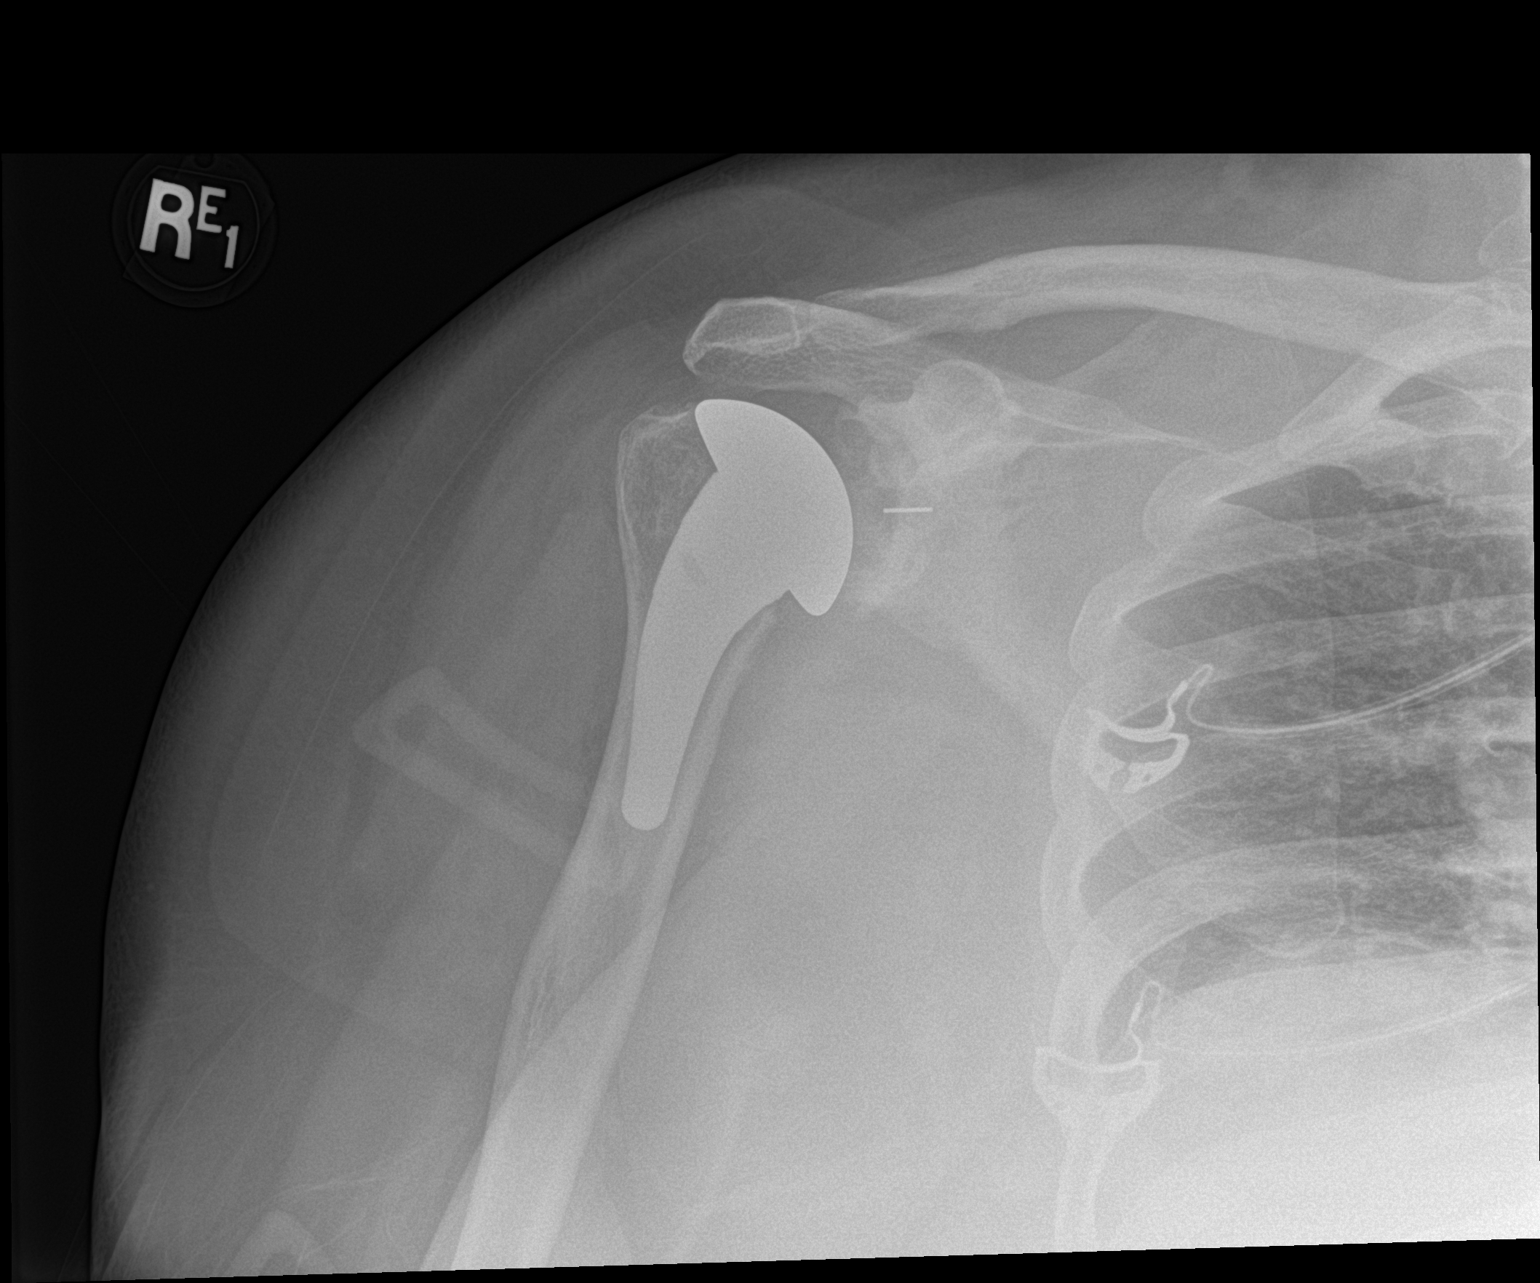

[1 of 1 positions shown; findings below may reference images not displayed]

FINDINGS: The total shoulder arthroplasty components appear well seated. No
complicating features are identified.
IMPRESSION: Well seated components of a total shoulder arthroplasty.

## 2022-10-05 ENCOUNTER — Encounter (HOSPITAL_COMMUNITY): Payer: Self-pay | Admitting: *Deleted

## 2022-10-05 ENCOUNTER — Emergency Department (HOSPITAL_COMMUNITY)
Admission: EM | Admit: 2022-10-05 | Discharge: 2022-10-06 | Disposition: A | Discharge status: Home / Self Care | Payer: Medicare (Managed Care) | Attending: Emergency Medicine | Admitting: Emergency Medicine

## 2022-10-05 ENCOUNTER — Other Ambulatory Visit: Payer: Self-pay

## 2022-10-05 DIAGNOSIS — R600 Localized edema: Secondary | ICD-10-CM | POA: Diagnosis present

## 2022-10-05 DIAGNOSIS — I10 Essential (primary) hypertension: Secondary | ICD-10-CM | POA: Insufficient documentation

## 2022-10-05 NOTE — ED Triage Notes (Signed)
The pt is c/o rt knee for over a week no known injury

## 2022-10-06 ENCOUNTER — Emergency Department (HOSPITAL_COMMUNITY): Payer: Medicare (Managed Care)

## 2022-10-06 LAB — COMPREHENSIVE METABOLIC PANEL
ALT: 16 U/L (ref 0–44)
AST: 23 U/L (ref 15–41)
Albumin: 3.4 g/dL — ABNORMAL LOW (ref 3.5–5.0)
Alkaline Phosphatase: 103 U/L (ref 38–126)
Anion gap: 14 (ref 5–15)
BUN: 17 mg/dL (ref 6–20)
CO2: 23 mmol/L (ref 22–32)
Calcium: 9 mg/dL (ref 8.9–10.3)
Chloride: 101 mmol/L (ref 98–111)
Creatinine, Ser: 0.78 mg/dL (ref 0.44–1.00)
GFR, Estimated: >60 mL/min (ref >60)
Glucose, Bld: 82 mg/dL (ref 70–99)
Potassium: 3.8 mmol/L (ref 3.5–5.1)
Sodium: 138 mmol/L (ref 135–145)
Total Bilirubin: 0.3 mg/dL (ref 0.3–1.2)
Total Protein: 7 g/dL (ref 6.5–8.1)

## 2022-10-06 LAB — CBC WITH DIFFERENTIAL/PLATELET
Abs Immature Granulocytes: 0.01 10*3/uL (ref 0.00–0.07)
Basophils Absolute: 0 10*3/uL (ref 0.0–0.1)
Basophils Relative: 0 %
Eosinophils Absolute: 0.2 10*3/uL (ref 0.0–0.5)
Eosinophils Relative: 3 %
HCT: 44.3 % (ref 36.0–46.0)
Hemoglobin: 13.2 g/dL (ref 12.0–15.0)
Immature Granulocytes: 0 %
Lymphocytes Relative: 38 %
Lymphs Abs: 2.7 10*3/uL (ref 0.7–4.0)
MCH: 24.4 pg — ABNORMAL LOW (ref 26.0–34.0)
MCHC: 29.8 g/dL — ABNORMAL LOW (ref 30.0–36.0)
MCV: 82 fL (ref 80.0–100.0)
Monocytes Absolute: 1 10*3/uL (ref 0.1–1.0)
Monocytes Relative: 14 %
Neutro Abs: 3.3 10*3/uL (ref 1.7–7.7)
Neutrophils Relative %: 45 %
Platelets: 180 10*3/uL (ref 150–400)
RBC: 5.4 MIL/uL — ABNORMAL HIGH (ref 3.87–5.11)
RDW: 15 % (ref 11.5–15.5)
WBC: 7.3 10*3/uL (ref 4.0–10.5)
nRBC: 0 % (ref 0.0–0.2)

## 2022-10-06 LAB — BRAIN NATRIURETIC PEPTIDE: B Natriuretic Peptide: 21.8 pg/mL (ref 0.0–100.0)

## 2022-10-06 NOTE — ED Provider Notes (Addendum)
[place] EMERGENCY DEPARTMENT AT [facility] Provider Note  CSN: [id number] Arrival date & time: 10/05/22 2245  Chief Complaint(s) Leg Swelling  HPI Diana Soto is a 62 y.o. female with a history of hypertension here for bilateral lower extremity edema and discomfort related to the edema.  Slowly worsening over the past several weeks.  No fall or trauma.  No redness.  Patient does report dyspnea on exertion over the past several months.  No associated chest pain.  No coughing or congestion.  No other physical complaints.  The history is provided by the patient.    Past Medical History Past Medical History:  Diagnosis Date   Arthritis    Glaucoma    Hypertension    Stroke [facility])    Patient Active Problem List   Diagnosis Date Noted   S/P arthroscopy of right shoulder 08/13/2020   Home Medication(s) Prior to Admission medications   Medication Sig Start Date End Date Taking? Authorizing Provider  amLODipine (NORVASC) 10 MG tablet Take 10 mg by mouth daily.    [provider]  carvedilol (COREG) 12.5 MG tablet Take 12.5 mg by mouth 2 (two) times daily.    [provider]  ferrous gluconate (FERGON) 324 MG tablet Take 658 mg by mouth daily.    [provider]  hydrochlorothiazide (MICROZIDE) 12.5 MG capsule Take 12.5 mg by mouth daily.    [provider]  ibuprofen (ADVIL) 200 MG tablet Take 600 mg by mouth every 6 (six) hours as needed for mild pain.    [provider]  METHADONE HCL PO Take 40 mg by mouth daily.    [provider]  spironolactone (ALDACTONE) 25 MG tablet Take 25 mg by mouth daily.    [provider]  tiZANidine (ZANAFLEX) 4 MG tablet Take 1 tablet (4 mg total) by mouth every 8 (eight) hours as needed for muscle spasms. 08/14/20   Jiles Harold, PA-C                                                                                                                                     Allergies Lisinopril  Review of Systems Review of Systems As noted in HPI  Physical Exam Vital Signs  I have reviewed the triage vital signs BP 122/76   Pulse 68   Temp 97.9 F (36.6 C) (Oral)   Resp 17   Ht 5' (1.524 m)   Wt 92.6 kg   SpO2 94%   BMI 39.87 kg/m   Physical Exam Vitals reviewed.  Constitutional:      General: She is not in acute distress.    Appearance: She is well-developed. She is not diaphoretic.  HENT:     Head: Normocephalic and atraumatic.     Nose: Nose normal.  Eyes:     General: No scleral icterus.       Right eye: No discharge.  Left eye: No discharge.     Conjunctiva/sclera: Conjunctivae normal.     Pupils: Pupils are equal, round, and reactive to light.     Comments: Corneal opacity.   Cardiovascular:     Rate and Rhythm: Normal rate and regular rhythm.     Heart sounds: No murmur heard.    No friction rub. No gallop.  Pulmonary:     Effort: Pulmonary effort is normal. No respiratory distress.     Breath sounds: Normal breath sounds. No stridor. No rales.  Abdominal:     General: There is no distension.     Palpations: Abdomen is soft.     Tenderness: There is no abdominal tenderness.  Musculoskeletal:        General: No tenderness.     Cervical back: Normal range of motion and neck supple.  Skin:    General: Skin is warm and dry.     Findings: No erythema or rash.  Neurological:     Mental Status: She is alert and oriented to person, place, and time.     ED Results and Treatments Labs (all labs ordered are listed, but only abnormal results are displayed) Labs Reviewed  CBC WITH DIFFERENTIAL/PLATELET - Abnormal; Notable for the following components:      Result Value   RBC 5.40 (*)    MCH 24.4 (*)    MCHC 29.8 (*)    All other components within normal limits  COMPREHENSIVE METABOLIC PANEL - Abnormal; Notable for the following components:   Albumin 3.4 (*)    All other components within normal limits  BRAIN  NATRIURETIC PEPTIDE                                                                                                                         EKG  EKG Interpretation  Date/Time:  Thursday October 06 2022 02:55:33 EDT Ventricular Rate:  74 PR Interval:  144 QRS Duration: 87 QT Interval:  436 QTC Calculation: 484 R Axis:   34 Text Interpretation: Sinus rhythm Confirmed by Drema Pry [phone]) on 10/06/2022 4:24:28 AM       Radiology DG Chest 1 View  Result Date: 10/06/2022 CLINICAL DATA:  Dyspnea on exertion EXAM: PORTABLE CHEST 1 VIEW COMPARISON:  08/11/2020 FINDINGS: Cardiac shadow is enlarged but stable. Lungs are well aerated bilaterally. No bony abnormality is seen. IMPRESSION: No acute abnormality noted. Electronically Signed   By: Alcide Clever M.D.   On: 10/06/2022 02:37    Medications Ordered in ED Medications - No data to display Procedures Procedures  (including critical care time) Medical Decision Making / ED Course   Medical Decision Making Amount and/or Complexity of Data Reviewed Labs: ordered. Decision-making details documented in ED Course. Radiology: ordered and independent interpretation performed. Decision-making details documented in ED Course. ECG/medicine tests: ordered and independent interpretation performed. Decision-making details documented in ED Course.    Patient presents with bilateral lower extremity edema and dyspnea on exertion Ongoing for several weeks  to months.  Will assess for evidence of heart failure, renal insufficiency or liver insufficiency.  Also considering dependent edema versus venous stasis which may be more likely.  Patient is also on amlodipine which may contribute to the extremity edema.  CBC without leukocytosis.  No anemia. CMP without significant electrolyte derangements or renal insufficiency.  Normal LFTs. BNP normal Chest x-ray without evidence of pneumonia, pneumothorax, pulmonary edema or pleural effusions.  No evidence  of cardiomegaly.  Recommended compression stockings.  PCP follow-up.    Final Clinical Impression(s) / ED Diagnoses Final diagnoses:  Peripheral edema   The patient appears reasonably screened and/or stabilized for discharge and I doubt any other medical condition or other [facility] requiring further screening, evaluation, or treatment in the ED at this time. I have discussed the findings, Dx and Tx plan with the patient/family who expressed understanding and agree(s) with the plan. Discharge instructions discussed at length. The patient/family was given strict return precautions who verbalized understanding of the instructions. No further questions at time of discharge.  Disposition: Discharge  Condition: Good  ED Discharge Orders     None         Follow Up: Knox Royalty, MD [address] [place] [address] [phone]  Call  to schedule an appointment for close follow up    This chart was dictated using voice recognition software.  Despite best efforts to proofread,  errors can occur which can change the documentation meaning.    Nira Conn, MD 10/06/22 [phone]

## 2022-10-11 ENCOUNTER — Ambulatory Visit (INDEPENDENT_AMBULATORY_CARE_PROVIDER_SITE_OTHER): Payer: Medicare (Managed Care)

## 2022-10-11 ENCOUNTER — Other Ambulatory Visit: Payer: Self-pay

## 2022-10-11 ENCOUNTER — Ambulatory Visit (HOSPITAL_COMMUNITY)
Admission: EM | Admit: 2022-10-11 | Discharge: 2022-10-11 | Disposition: A | Discharge status: Home / Self Care | Payer: Medicare (Managed Care) | Attending: Emergency Medicine | Admitting: Emergency Medicine

## 2022-10-11 ENCOUNTER — Encounter (HOSPITAL_COMMUNITY): Payer: Self-pay | Admitting: Emergency Medicine

## 2022-10-11 DIAGNOSIS — M25561 Pain in right knee: Secondary | ICD-10-CM

## 2022-10-11 DIAGNOSIS — S8001XA Contusion of right knee, initial encounter: Secondary | ICD-10-CM

## 2022-10-11 MED ORDER — NAPROXEN 500 MG PO TABS: 500.0000 mg | ORAL_TABLET | Freq: Two times a day (BID) | ORAL | 0 refills | Status: AC

## 2022-10-11 NOTE — Discharge Instructions (Signed)
I did not see any acute fracture on your x-ray.  I will contact you if the radiology overread differs enough from mine so that we need to change your management.  Wear the knee brace as needed for comfort.  Ice and elevate your knee above your heart.  May take Naprosyn with 1000 mg of Tylenol twice a day.  May take an additional 1000 mg of Tylenol 1 more time per day for pain.  Please follow-up with [facility] if not getting any better with conservative treatment in a week to 10 days.
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
## 2024-02-22 ENCOUNTER — Emergency Department (HOSPITAL_COMMUNITY)
Admission: EM | Admit: 2024-02-22 | Discharge: 2024-02-22 | Disposition: A | Discharge status: Home / Self Care | Payer: Medicare (Managed Care) | Attending: Emergency Medicine | Admitting: Emergency Medicine

## 2024-02-22 ENCOUNTER — Encounter (HOSPITAL_COMMUNITY): Payer: Self-pay

## 2024-02-22 ENCOUNTER — Emergency Department (HOSPITAL_COMMUNITY): Payer: Medicare (Managed Care)

## 2024-02-22 ENCOUNTER — Other Ambulatory Visit: Payer: Self-pay

## 2024-02-22 DIAGNOSIS — R519 Headache, unspecified: Secondary | ICD-10-CM | POA: Diagnosis present

## 2024-02-22 LAB — CBC WITH DIFFERENTIAL/PLATELET
Abs Immature Granulocytes: 0.02 K/uL (ref 0.00–0.07)
Basophils Absolute: 0 K/uL (ref 0.0–0.1)
Basophils Relative: 0 %
Eosinophils Absolute: 0.3 K/uL (ref 0.0–0.5)
Eosinophils Relative: 5 %
HCT: 40.9 % (ref 36.0–46.0)
Hemoglobin: 12.1 g/dL (ref 12.0–15.0)
Immature Granulocytes: 0 %
Lymphocytes Relative: 28 %
Lymphs Abs: 1.9 K/uL (ref 0.7–4.0)
MCH: 23 pg — ABNORMAL LOW (ref 26.0–34.0)
MCHC: 29.6 g/dL — ABNORMAL LOW (ref 30.0–36.0)
MCV: 77.9 fL — ABNORMAL LOW (ref 80.0–100.0)
Monocytes Absolute: 0.9 K/uL (ref 0.1–1.0)
Monocytes Relative: 14 %
Neutro Abs: 3.6 K/uL (ref 1.7–7.7)
Neutrophils Relative %: 53 %
Platelets: 206 K/uL (ref 150–400)
RBC: 5.25 MIL/uL — ABNORMAL HIGH (ref 3.87–5.11)
RDW: 15.9 % — ABNORMAL HIGH (ref 11.5–15.5)
WBC: 6.8 K/uL (ref 4.0–10.5)
nRBC: 0 % (ref 0.0–0.2)

## 2024-02-22 LAB — COMPREHENSIVE METABOLIC PANEL WITH GFR
ALT: 9 U/L (ref 0–44)
AST: 26 U/L (ref 15–41)
Albumin: 3.8 g/dL (ref 3.5–5.0)
Alkaline Phosphatase: 154 U/L — ABNORMAL HIGH (ref 38–126)
Anion gap: 7 (ref 5–15)
BUN: 11 mg/dL (ref 8–23)
CO2: 35 mmol/L — ABNORMAL HIGH (ref 22–32)
Calcium: 9.2 mg/dL (ref 8.9–10.3)
Chloride: 103 mmol/L (ref 98–111)
Creatinine, Ser: 0.69 mg/dL (ref 0.44–1.00)
GFR, Estimated: >60 mL/min (ref >60)
Glucose, Bld: 92 mg/dL (ref 70–99)
Potassium: 4 mmol/L (ref 3.5–5.1)
Sodium: 145 mmol/L (ref 135–145)
Total Bilirubin: 0.4 mg/dL (ref 0.0–1.2)
Total Protein: 7 g/dL (ref 6.5–8.1)

## 2024-02-22 MED ORDER — SODIUM CHLORIDE 0.9 % IV BOLUS
1000.0000 mL | Freq: Once | INTRAVENOUS | Status: AC
  Administered 2024-02-22: 1000 mL via INTRAVENOUS

## 2024-02-22 MED ORDER — SPIRONOLACTONE 25 MG PO TABS
25.0000 mg | ORAL_TABLET | Freq: Every day | ORAL | Status: DC
  Administered 2024-02-22: 25 mg via ORAL
  Filled 2024-02-22: qty 1

## 2024-02-22 MED ORDER — PROCHLORPERAZINE EDISYLATE 10 MG/2ML IJ SOLN
10.0000 mg | Freq: Once | INTRAMUSCULAR | Status: AC
Start: 2024-02-22 — End: 2024-02-22
  Administered 2024-02-22: 10 mg via INTRAVENOUS
  Filled 2024-02-22: qty 2

## 2024-02-22 MED ORDER — AMLODIPINE BESYLATE 5 MG PO TABS
10.0000 mg | ORAL_TABLET | Freq: Every day | ORAL | Status: DC
  Administered 2024-02-22: 10 mg via ORAL
  Filled 2024-02-22: qty 2

## 2024-02-22 MED ORDER — CARVEDILOL 12.5 MG PO TABS
12.5000 mg | ORAL_TABLET | Freq: Two times a day (BID) | ORAL | Status: DC
  Administered 2024-02-22: 12.5 mg via ORAL
  Filled 2024-02-22: qty 1

## 2024-02-22 NOTE — ED Provider Notes (Signed)
 3:21 PM  [place] EMERGENCY DEPARTMENT AT [facility] Provider Note   CSN: [id number] Arrival date & time: 02/22/24  1143     Patient presents with: Headache and Hypertension   DEDRA MATSUO is a 63 y.o. female.   HPI Patient presents via EMS with headache.  History is obtained by the patient and EMS providers.  Presents from [facility] with concern for headache.  She notes that she has not taken her blood pressure medication for 2 days, today has had a headache all day, without focal weakness, confusion, syncope.  Coworkers noted patient may have been slower than usual patient is unaware of this.     Prior to Admission medications   Medication Sig Start Date End Date Taking? Authorizing Provider  amLODipine  (NORVASC ) 10 MG tablet Take 10 mg by mouth daily.    [provider]  carvedilol  (COREG ) 12.5 MG tablet Take 12.5 mg by mouth 2 (two) times daily.    [provider]  ferrous gluconate  (FERGON) 324 MG tablet Take 658 mg by mouth daily.    [provider]  hydrochlorothiazide  (MICROZIDE ) 12.5 MG capsule Take 12.5 mg by mouth daily.    [provider]  METHADONE  HCL PO Take 40 mg by mouth daily.    [provider]  naproxen  (NAPROSYN ) 500 MG tablet Take 1 tablet (500 mg total) by mouth 2 (two) times daily. 10/11/22   Van Knee, MD  spironolactone  (ALDACTONE ) 25 MG tablet Take 25 mg by mouth daily.    [provider]  tiZANidine  (ZANAFLEX ) 4 MG tablet Take 1 tablet (4 mg total) by mouth every 8 (eight) hours as needed for muscle spasms. 08/14/20   Laliberte, Danielle, PA-C    Allergies: Lisinopril    Review of Systems  Updated Vital Signs BP (!) 140/99 (BP Location: Left Arm)   Pulse 83   Temp 98.1 F (36.7 C) (Oral)   Resp 16   SpO2 100%   Physical Exam Vitals and nursing note reviewed.  Constitutional:      General: She is not in acute distress.    Appearance: She is well-developed. She is not  ill-appearing or diaphoretic.  HENT:     Head: Normocephalic and atraumatic.  Eyes:     Conjunctiva/sclera: Conjunctivae normal.  Cardiovascular:     Rate and Rhythm: Normal rate and regular rhythm.  Pulmonary:     Effort: Pulmonary effort is normal. No respiratory distress.     Breath sounds: No stridor.  Abdominal:     General: There is no distension.  Skin:    General: Skin is warm and dry.  Neurological:     Mental Status: She is alert and oriented to person, place, and time.     Cranial Nerves: No cranial nerve deficit.     Comments: Patient is blind  Psychiatric:        Mood and Affect: Mood normal.     (all labs ordered are listed, but only abnormal results are displayed) Labs Reviewed  COMPREHENSIVE METABOLIC PANEL WITH GFR - Abnormal; Notable for the following components:      Result Value   CO2 35 (*)    Alkaline Phosphatase 154 (*)    All other components within normal limits  CBC WITH DIFFERENTIAL/PLATELET - Abnormal; Notable for the following components:   RBC 5.25 (*)    MCV 77.9 (*)    MCH 23.0 (*)    MCHC 29.6 (*)    RDW 15.9 (*)  All other components within normal limits    EKG: None  Radiology: CT Head Wo Contrast Result Date: 02/22/2024 CLINICAL DATA:  Headache intermittently 1 week. Elevated blood pressure today. Has not taken hypertensive meds 2 days. EXAM: CT HEAD WITHOUT CONTRAST TECHNIQUE: Contiguous axial images were obtained from the base of the skull through the vertex without intravenous contrast. RADIATION DOSE REDUCTION: This exam was performed according to the departmental dose-optimization program which includes automated exposure control, adjustment of the mA and/or kV according to patient size and/or use of iterative reconstruction technique. COMPARISON:  None Available. FINDINGS: Brain: Ventricles, cisterns and other CSF spaces are normal. There is moderate chronic ischemic microvascular disease present. Old left thalamic lacunar  infarct. No mass, mass effect or shift of midline structures. No acute hemorrhage. Vascular: No hyperdense vessel or unexpected calcification. Skull: Normal. Negative for fracture or focal lesion. Sinuses/Orbits: Left orbit is normal. Prosthetic right globe. Paranasal sinuses are clear. Other: None. IMPRESSION: 1. No acute findings. 2. Moderate chronic ischemic microvascular disease. Old left thalamic lacunar infarct. Electronically Signed   By: Toribio Agreste M.D.   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  She is hypertensive on arrival, concern for hypertensive urgency versus acute headache, intracranial abnormalities considered, though preserved neuroexam is reassuring. Cardiac 80 sinus normal pulse ox 100% room air normal  Amount and/or Complexity of Data Reviewed Independent Historian: EMS External Data Reviewed: notes. Labs: ordered. Decision-making details documented in ED Course. Radiology: ordered. ECG/medicine tests: ordered.  Risk Prescription drug management.  Head CT unremarkable 3:21 PM Patient awake, alert, smiling, states that she is ready to leave.  I reviewed the findings, discussed with her and her son-in-law at bedside.  Reassuring labs, no evidence for endorgan damage, we discussed importance of taking medication as prescribed, and patient is discharged in stable condition.   Final diagnoses:  Bad headache    ED  Discharge Orders     None          Diana Charleston, MD 02/22/24 1521

## 2024-02-22 NOTE — Discharge Instructions (Signed)
 As discussed, your evaluation today has been largely reassuring.  But, it is important that you monitor your condition carefully, and do not hesitate to return to the ED if you develop new, or concerning changes in your condition. ? ?Otherwise, please follow-up with your physician for appropriate ongoing care. ? ?

## 2024-02-22 NOTE — ED Triage Notes (Signed)
 Pt BIB ems for having a headache off and on throughout the week. Pt has HTN, elevated BP today, hasn't taken BP meds for 2 days. 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 Denies N/V/D or chest pain. A&Ox4

## 2024-02-22 NOTE — ED Notes (Signed)
IV Team at bedside
